# Patient Record
Sex: Female | Born: 1970 | Race: White | Hispanic: No | Marital: Married | Smoking: Current every day smoker
Health system: Southern US, Community
[De-identification: ages and names within clinical notes are randomized; demographics above are authoritative.]

## PROBLEM LIST (undated history)

## (undated) DIAGNOSIS — E785 Hyperlipidemia, unspecified: Secondary | ICD-10-CM

## (undated) DIAGNOSIS — F419 Anxiety disorder, unspecified: Secondary | ICD-10-CM

## (undated) DIAGNOSIS — D649 Anemia, unspecified: Secondary | ICD-10-CM

## (undated) DIAGNOSIS — R519 Headache, unspecified: Secondary | ICD-10-CM

## (undated) DIAGNOSIS — R112 Nausea with vomiting, unspecified: Secondary | ICD-10-CM

## (undated) DIAGNOSIS — F32A Depression, unspecified: Secondary | ICD-10-CM

## (undated) DIAGNOSIS — K219 Gastro-esophageal reflux disease without esophagitis: Secondary | ICD-10-CM

## (undated) DIAGNOSIS — F4310 Post-traumatic stress disorder, unspecified: Secondary | ICD-10-CM

## (undated) DIAGNOSIS — M1990 Unspecified osteoarthritis, unspecified site: Secondary | ICD-10-CM

## (undated) DIAGNOSIS — F319 Bipolar disorder, unspecified: Secondary | ICD-10-CM

## (undated) HISTORY — PX: OOPHORECTOMY: SHX6387

## (undated) HISTORY — PX: FEMUR LENGTHENING PROCEDURE: SHX1596

## (undated) HISTORY — PX: HERNIA REPAIR: SHX51

## (undated) HISTORY — PX: BREAST LUMPECTOMY: SHX2

## (undated) HISTORY — PX: DIAGNOSTIC LAPAROSCOPY: SUR761

---

## 1998-05-23 ENCOUNTER — Ambulatory Visit (HOSPITAL_COMMUNITY): Admission: RE | Admit: 1998-05-23 | Discharge: 1998-05-23 | Payer: Self-pay | Admitting: Gastroenterology

## 2000-01-03 ENCOUNTER — Encounter: Payer: Self-pay | Admitting: Orthopedic Surgery

## 2000-01-03 ENCOUNTER — Inpatient Hospital Stay (HOSPITAL_COMMUNITY): Admission: RE | Admit: 2000-01-03 | Discharge: 2000-01-06 | Payer: Self-pay | Admitting: Orthopedic Surgery

## 2000-03-27 ENCOUNTER — Encounter: Payer: Self-pay | Admitting: Orthopedic Surgery

## 2000-03-27 ENCOUNTER — Ambulatory Visit (HOSPITAL_COMMUNITY): Admission: RE | Admit: 2000-03-27 | Discharge: 2000-03-27 | Payer: Self-pay | Admitting: Orthopedic Surgery

## 2000-08-30 ENCOUNTER — Encounter: Payer: Self-pay | Admitting: Orthopedic Surgery

## 2000-08-30 ENCOUNTER — Observation Stay (HOSPITAL_COMMUNITY): Admission: RE | Admit: 2000-08-30 | Discharge: 2000-08-31 | Payer: Self-pay | Admitting: Orthopedic Surgery

## 2001-05-08 ENCOUNTER — Ambulatory Visit (HOSPITAL_BASED_OUTPATIENT_CLINIC_OR_DEPARTMENT_OTHER): Admission: RE | Admit: 2001-05-08 | Discharge: 2001-05-08 | Payer: Self-pay | Admitting: Orthopedic Surgery

## 2001-05-08 ENCOUNTER — Encounter: Payer: Self-pay | Admitting: Orthopedic Surgery

## 2001-07-22 ENCOUNTER — Ambulatory Visit (HOSPITAL_COMMUNITY): Admission: RE | Admit: 2001-07-22 | Discharge: 2001-07-22 | Payer: Self-pay | Admitting: Family Medicine

## 2001-07-22 ENCOUNTER — Encounter: Payer: Self-pay | Admitting: Family Medicine

## 2001-07-28 ENCOUNTER — Encounter: Payer: Self-pay | Admitting: Family Medicine

## 2001-07-28 ENCOUNTER — Ambulatory Visit (HOSPITAL_COMMUNITY): Admission: RE | Admit: 2001-07-28 | Discharge: 2001-07-28 | Payer: Self-pay | Admitting: Family Medicine

## 2001-12-22 ENCOUNTER — Observation Stay (HOSPITAL_COMMUNITY): Admission: RE | Admit: 2001-12-22 | Discharge: 2001-12-23 | Payer: Self-pay | Admitting: Obstetrics and Gynecology

## 2002-10-07 ENCOUNTER — Other Ambulatory Visit: Admission: RE | Admit: 2002-10-07 | Discharge: 2002-10-07 | Payer: Self-pay | Admitting: Obstetrics and Gynecology

## 2005-09-01 ENCOUNTER — Emergency Department (HOSPITAL_COMMUNITY): Admission: EM | Admit: 2005-09-01 | Discharge: 2005-09-01 | Payer: Self-pay | Admitting: Emergency Medicine

## 2006-02-27 ENCOUNTER — Other Ambulatory Visit: Admission: RE | Admit: 2006-02-27 | Discharge: 2006-02-27 | Payer: Self-pay | Admitting: Gynecology

## 2006-03-27 ENCOUNTER — Encounter: Admission: RE | Admit: 2006-03-27 | Discharge: 2006-03-27 | Payer: Self-pay | Admitting: Gynecology

## 2006-04-01 ENCOUNTER — Encounter: Admission: RE | Admit: 2006-04-01 | Discharge: 2006-04-01 | Payer: Self-pay | Admitting: Gynecology

## 2006-04-19 ENCOUNTER — Ambulatory Visit (HOSPITAL_COMMUNITY): Admission: RE | Admit: 2006-04-19 | Discharge: 2006-04-19 | Payer: Self-pay | Admitting: Gynecology

## 2006-05-21 ENCOUNTER — Encounter: Admission: RE | Admit: 2006-05-21 | Discharge: 2006-05-21 | Payer: Self-pay | Admitting: Gynecology

## 2006-06-03 ENCOUNTER — Encounter: Admission: RE | Admit: 2006-06-03 | Discharge: 2006-06-03 | Payer: Self-pay | Admitting: Surgery

## 2006-06-06 ENCOUNTER — Emergency Department (HOSPITAL_COMMUNITY): Admission: EM | Admit: 2006-06-06 | Discharge: 2006-06-06 | Payer: Self-pay | Admitting: *Deleted

## 2006-06-09 ENCOUNTER — Emergency Department (HOSPITAL_COMMUNITY): Admission: EM | Admit: 2006-06-09 | Discharge: 2006-06-09 | Payer: Self-pay | Admitting: Emergency Medicine

## 2006-06-20 ENCOUNTER — Ambulatory Visit (HOSPITAL_COMMUNITY): Admission: RE | Admit: 2006-06-20 | Discharge: 2006-06-20 | Payer: Self-pay | Admitting: Gynecology

## 2006-06-20 ENCOUNTER — Encounter (INDEPENDENT_AMBULATORY_CARE_PROVIDER_SITE_OTHER): Payer: Self-pay | Admitting: Specialist

## 2006-07-25 ENCOUNTER — Encounter: Admission: RE | Admit: 2006-07-25 | Discharge: 2006-07-25 | Payer: Self-pay | Admitting: Surgery

## 2006-07-25 ENCOUNTER — Ambulatory Visit (HOSPITAL_BASED_OUTPATIENT_CLINIC_OR_DEPARTMENT_OTHER): Admission: RE | Admit: 2006-07-25 | Discharge: 2006-07-25 | Payer: Self-pay | Admitting: Surgery

## 2006-07-25 ENCOUNTER — Encounter (INDEPENDENT_AMBULATORY_CARE_PROVIDER_SITE_OTHER): Payer: Self-pay | Admitting: Specialist

## 2009-03-20 ENCOUNTER — Inpatient Hospital Stay (HOSPITAL_COMMUNITY): Admission: EM | Admit: 2009-03-20 | Discharge: 2009-03-25 | Payer: Self-pay | Admitting: Emergency Medicine

## 2009-03-25 ENCOUNTER — Ambulatory Visit: Payer: Self-pay | Admitting: *Deleted

## 2009-03-25 ENCOUNTER — Inpatient Hospital Stay (HOSPITAL_COMMUNITY): Admission: AD | Admit: 2009-03-25 | Discharge: 2009-03-28 | Payer: Self-pay | Admitting: *Deleted

## 2010-07-05 IMAGING — CR DG CHEST 1V PORT
1 series · 1 of 1 positions shown · non-contrast
Comparison: None

CLINICAL DATA: Overdose

PORTABLE CHEST - 1 VIEW

[view not recorded]
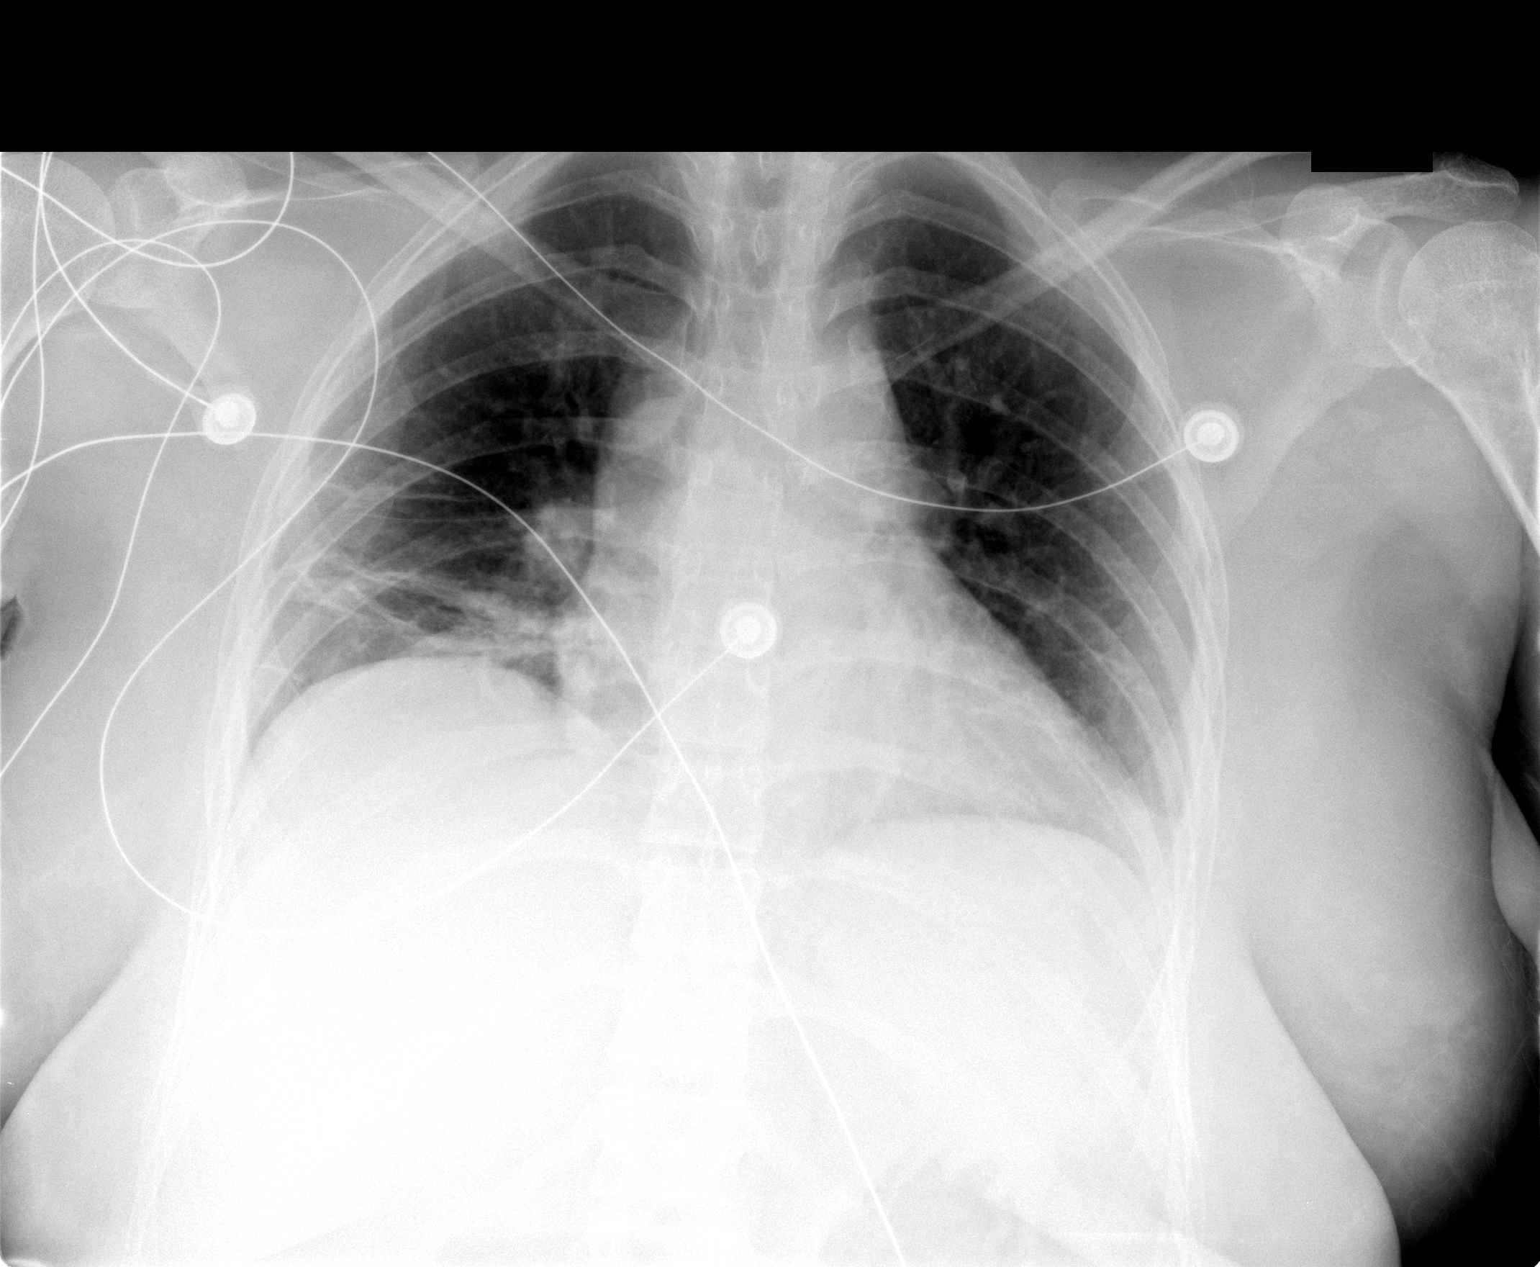

[1 of 1 positions shown; findings below may reference images not displayed]

FINDINGS: Right lower lobe subsegmental atelectasis.  Negative for
edema.  Heart and mediastinal contours are normal.
IMPRESSION: Right lower lobe subsegmental atelectasis.

## 2010-08-07 ENCOUNTER — Ambulatory Visit (HOSPITAL_COMMUNITY): Admission: RE | Admit: 2010-08-07 | Discharge: 2010-08-07 | Payer: Self-pay | Admitting: Surgery

## 2010-11-19 ENCOUNTER — Encounter: Payer: Self-pay | Admitting: Gynecology

## 2010-11-19 ENCOUNTER — Encounter: Payer: Self-pay | Admitting: Surgery

## 2011-01-11 LAB — BASIC METABOLIC PANEL
Calcium: 9.1 mg/dL (ref 8.4–10.5)
Creatinine, Ser: 0.94 mg/dL (ref 0.4–1.2)
GFR calc Af Amer: >60 mL/min (ref >60)
GFR calc non Af Amer: >60 mL/min (ref >60)
Glucose, Bld: 120 mg/dL — ABNORMAL HIGH (ref 70–99)
Potassium: 3.5 mEq/L (ref 3.5–5.1)
Sodium: 140 mEq/L (ref 135–145)

## 2011-01-11 LAB — DIFFERENTIAL
Eosinophils Absolute: 0.1 10*3/uL (ref 0.0–0.7)
Eosinophils Relative: 1 % (ref 0–5)
Lymphocytes Relative: 26 % (ref 12–46)
Lymphs Abs: 3.3 10*3/uL (ref 0.7–4.0)
Monocytes Absolute: 0.5 10*3/uL (ref 0.1–1.0)
Monocytes Relative: 4 % (ref 3–12)
Neutro Abs: 8.9 10*3/uL — ABNORMAL HIGH (ref 1.7–7.7)
Neutrophils Relative %: 69 % (ref 43–77)

## 2011-01-11 LAB — CBC
HCT: 38.8 % (ref 36.0–46.0)
Hemoglobin: 13.4 g/dL (ref 12.0–15.0)
MCV: 89.7 fL (ref 78.0–100.0)
RDW: 13.3 % (ref 11.5–15.5)
WBC: 12.9 10*3/uL — ABNORMAL HIGH (ref 4.0–10.5)

## 2011-01-11 LAB — SURGICAL PCR SCREEN: Staphylococcus aureus: POSITIVE — AB

## 2011-01-11 LAB — PREGNANCY, URINE: Preg Test, Ur: NEGATIVE

## 2011-02-06 LAB — COMPREHENSIVE METABOLIC PANEL
ALT: 12 U/L (ref 0–35)
ALT: 12 U/L (ref 0–35)
AST: 18 U/L (ref 0–37)
AST: 24 U/L (ref 0–37)
Albumin: 3.3 g/dL — ABNORMAL LOW (ref 3.5–5.2)
Albumin: 3.5 g/dL (ref 3.5–5.2)
Alkaline Phosphatase: 66 U/L (ref 39–117)
BUN: 17 mg/dL (ref 6–23)
CO2: 23 mEq/L (ref 19–32)
Calcium: 8.2 mg/dL — ABNORMAL LOW (ref 8.4–10.5)
Calcium: 8.8 mg/dL (ref 8.4–10.5)
Chloride: 105 mEq/L (ref 96–112)
Chloride: 108 mEq/L (ref 96–112)
GFR calc Af Amer: >60 mL/min (ref >60)
GFR calc non Af Amer: >60 mL/min (ref >60)
GFR calc non Af Amer: >60 mL/min (ref >60)
Potassium: 3.9 mEq/L (ref 3.5–5.1)
Total Bilirubin: 0.5 mg/dL (ref 0.3–1.2)
Total Protein: 6.3 g/dL (ref 6.0–8.3)
Total Protein: 6.5 g/dL (ref 6.0–8.3)

## 2011-02-06 LAB — RAPID URINE DRUG SCREEN, HOSP PERFORMED: Amphetamines: NOT DETECTED

## 2011-02-06 LAB — CBC
HCT: 37.7 % (ref 36.0–46.0)
HCT: 39.2 % (ref 36.0–46.0)
Hemoglobin: 13.8 g/dL (ref 12.0–15.0)
MCHC: 33.2 g/dL (ref 30.0–36.0)
MCHC: 35.3 g/dL (ref 30.0–36.0)
MCV: 89.1 fL (ref 78.0–100.0)
Platelets: 286 10*3/uL (ref 150–400)
RDW: 13.2 % (ref 11.5–15.5)
WBC: 10.1 10*3/uL (ref 4.0–10.5)
WBC: 17 10*3/uL — ABNORMAL HIGH (ref 4.0–10.5)

## 2011-02-06 LAB — APTT: aPTT: 28 seconds (ref 24–37)

## 2011-02-06 LAB — DIFFERENTIAL
Basophils Absolute: 0.1 10*3/uL (ref 0.0–0.1)
Basophils Relative: 1 % (ref 0–1)
Eosinophils Absolute: 0.6 10*3/uL (ref 0.0–0.7)
Eosinophils Relative: 6 % — ABNORMAL HIGH (ref 0–5)
Lymphs Abs: 1.2 10*3/uL (ref 0.7–4.0)
Lymphs Abs: 2.3 10*3/uL (ref 0.7–4.0)
Monocytes Relative: 3 % (ref 3–12)
Neutro Abs: 15.1 10*3/uL — ABNORMAL HIGH (ref 1.7–7.7)
Neutrophils Relative %: 66 % (ref 43–77)

## 2011-02-06 LAB — URINALYSIS, ROUTINE W REFLEX MICROSCOPIC
Ketones, ur: NEGATIVE mg/dL
Nitrite: NEGATIVE
Specific Gravity, Urine: 1.012 (ref 1.005–1.030)

## 2011-02-06 LAB — BASIC METABOLIC PANEL
BUN: 10 mg/dL (ref 6–23)
CO2: 25 mEq/L (ref 19–32)
Calcium: 8.4 mg/dL (ref 8.4–10.5)
Creatinine, Ser: 0.84 mg/dL (ref 0.4–1.2)
GFR calc Af Amer: >60 mL/min (ref >60)

## 2011-02-06 LAB — PROTIME-INR: INR: 1 (ref 0.00–1.49)

## 2011-03-13 NOTE — H&P (Signed)
NAME:  Meghan Mcpherson, Meghan Mcpherson                ACCOUNT NO.:  [id number]   MEDICAL RECORD NO.:  [id number]          PATIENT TYPE:  IPS   LOCATION:  0304                          [facility]ACILITY[facility]  [facility]   PHYSICIAN:  Vic Ripper, P.A.-C.DATE OF BIRTH:  Jun 12, 1971   DATE OF ADMISSION:  03/25/2009  DATE OF DISCHARGE:                       PSYCHIATRIC ADMISSION ASSESSMENT   This is a 40 year old separated white female.  This is a voluntary  admission to the services of Dr. Milford Cage.  Today's date is May  29th.  Apparently, she had texted her boyfriend that she had overdosed.  She took approximately 90 trazodone, 15 Flexeril, 30 Abilify, 80  Tranxene and 15-30 Luvox.  This was after they had argued.  She stated  she would not be alive tomorrow and intentionally took the overdose.  Apparently, she does this frequently, although this time she actually  did take the pills.  He did not believe her.  She was at her  grandparent's.  When they could not awaken her the next morning, they  called 9-1-1.  She states that she was being weaned off Abilify, due to  a change in insurance carriers she could no longer afford the co-pay,  and when down to 5 mg she hit bottom.   PAST PSYCHIATRIC HISTORY:  She first began therapy at age 80 when her  father died.  She was given medication for depression at age 80.  At age  6-24 she began Luvox for OCD/depression.  She states she had no meds  for approximately 10 years.  Then in April of 2009 her husband left and  she was restarted on Luvox.  She is followed by Dr. Beryle Lathe at [facility]  She is currently prescribed Luvox, Lamictal and  Abilify.   SOCIAL HISTORY:  She has an associate's degree.  She has been married  once, separated with no children.  She works the Psychologist, sport and exercise at [employer].   FAMILY HISTORY:  On the maternal side of the family there is a history  for OCD and depression.  On the paternal side depression.   ALCOHOL AND  DRUG HISTORY:  She denies.   PRIMARY CARE Vaun Hyndman:  Dr. Tenny Craw, at [facility]   PSYCHIATRIST:  Dr. Beryle Lathe, at [facility], and she does  have a therapist although she is interested in getting a new therapist,  one that is closer and more available.   MEDICAL PROBLEMS:  She is known to have GERD and endometriosis.   MEDICATIONS:  She is currently prescribed:  1. Metamucil daily.  2. Protonix 40 mg p.o. b.i.d.  3. Abilify 10 mg at hour of sleep.  4. Tranxene 15 mg p.o. b.i.d. p.r.n. anxiety.  5. Lamictal 200 mg p.o. daily.  6. Luvox 150 mg at nightly.  7. Hormone for endometriosis, it is 5 mg p.o. daily.   DRUG ALLERGIES:  No known drug allergies.   POSITIVE PHYSICAL FINDINGS:  She does have a rash under her arms and  apparently while in the hospital she was admitted on May 23rd, she was  given some kind of prednisone or something for that.  However, we will  just treat that with local hydrocortisone cream.   PHYSICAL EXAM:  Is otherwise well documented and in the chart.   MENTAL STATUS EXAM:  Today, she is alert and oriented.  She was  appropriately groomed and dressed in her own clothing.  Her mood is  depressed, her affect is congruent, her thought processes were clear,  rational and goal-oriented.  She wants to get back on the right meds.  Concentration and memory are intact.  Intelligence is at least average.  She still reports that she is at least passively suicidal, this is a  chronic situation for her.  She is not homicidal.  She is not having any  auditory or visual hallucinations.  Concentration and memory are intact,  intelligence is at least average.   DIAGNOSES:  Axis I:  Mood disorder, not otherwise specified.  Axis II:  Borderline personality disorder.  Axis III:  1.  Gastroesophageal reflux disease.  2.  Endometriosis.  Axis IV:  Problems with primary support group.  Axis V:  Global Assessment of Functioning 15.   PLAN:  Admit for  further stabilization and to provide safety.  Will work  with her regarding her discharge plan.  She is amenable to continuing  with her current psychiatrist, Dr. Beryle Lathe, but she is interested in  gaining a new therapist.  We are going to adjust her meds to:  1. Protonix 40 mg p.o. b.i.d.  2. Abilify 15 mg at hour of sleep.  3. Tranxene 15 mg p.o. b.i.d. p.r.n.  4. Lamictal 200 mg at hour of sleep.  5. Luvox 200 mg at hour of sleep.  6. Tranxene 15 mg p.o. at hour of sleep p.r.n.  7. Hydrocortisone cream 1%, apply to affected areas morning and hour      of sleep.      Vic Ripper, P.A.-C.     MD/MEDQ  D:  03/26/2009  T:  03/26/2009  Job:  [id number]
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
## 2011-03-13 NOTE — Discharge Summary (Signed)
NAME:  Meghan Mcpherson, Meghan Mcpherson NO.:  [id number]   MEDICAL RECORD NO.:  [id number]          PATIENT TYPE:  IPS   LOCATION:  0304                          FACILITY:  [facility]   PHYSICIAN:  Altha Harm, MDDATE OF BIRTH:  1971-02-07   DATE OF ADMISSION:  03/25/2009  DATE OF DISCHARGE:  03/26/2009                               DISCHARGE SUMMARY   Transfer to Behavioral Health.   DISCHARGE DIAGNOSES:  1. Failed suicide attempt.  2. Bipolar disorder.  3. Obtundation resolved.  4. Intertriginous rash.  5. Major depression.  6. Ongoing suicidal ideations.  7. History of endometriosis.  8. History of left salpingo-oophorectomy secondary to benign ovarian      cysts.  9. Weakness secondary likely to Abilify ingestion, resolved.  10.Chronic back pain.   MEDICATIONS AT DISCHARGE:  1. Protonix 40 mg p.o. b.i.d.  2. Pyridium 200 mg p.o. t.i.d.  3. Norephedrine 5 mg p.o. daily.  4. Famotidine 20 mg p.o. daily.  5. Lunesta 3 mg p.o. q.h.s. p.r.n.  6. Tranxene 15 mg p.o. b.i.d. p.r.n. anxiety.  7. Prednisone 40 mg p.o. daily x4 days.  8. Benadryl 50 mg p.o. q.h.s. p.r.n. itching.   CONSULTANTS:  Antonietta Breach, M.D.   PROCEDURE:  None.   DIAGNOSTIC STUDIES:  Portable chest x-ray done on admission shows a  right lower lobe subsegmental atelectasis.   CODE STATUS:  Full code.   ALLERGIES:  FLUTICAZONE.   CHIEF COMPLAINT:  Overdose.   HISTORY OF PRESENT ILLNESS:  Please refer to the History and Physical by  Dr. Della Goo for details of the HPI.   HOSPITAL COURSE:  1. The patient was in an obtunded state and stuporous on arrival to      the hospital after she had ingested unknown amounts of Abilify,      Effexor, Tranxene, Trazodone and Flexeril.  The patient was given      supportive care with oxygen, IV fluids, and monitored closely      during her hospitalization.  Within 48 to 72 hours, the patient      resolved her obtundation and stupor.  The patient  was seen in      consultation with Dr. Jeanie Sewer of psychiatry who deemed the      patient a candidate for inpatient rehabilitation based upon her      ongoing suicidal ideations.  2. The patient had a significant amount of weakness which I attributed      to the Flexeril, Tranxene and Ability.  Over the course of several      days, the weakness resolved and the patient at the time of my last      evaluation was ambulatory without any problems.  3. Rash.  Within the past 48 hours, the patient developed a pruritic,      erythematous rash which was initially just on the area of the right      flank.  However,  yesterday, the patient noticed that the rash was      in the area of her flank and also in both axillary areas, pruritic  in all those locations.  The rash was a confluent, macular,      erythematous, pruritic rash.  It was felt that the rash was likely      due to a contact dermatitis, likely with the deodorants that the      patient has been using in the axillary areas.  The patient did      report that she had this rash intermittently, however, it seems to      have worsened in the last 48 hours.  It was also felt that the      effects of the stress also contributed to the patient's rash.  I      doubt this is a global allergic reaction as the rash is only      occurring in very specific areas.  The patient was started on      prednisone 40 mg in addition to Benadryl for the antihistamine      effect.  I recommended that the patient have a medicine evaluation      while in Behavioral Health to continue to evaluate and make sure      that the rash is getting better and not worse.   Otherwise, the patient has remained stable.  She has essentially been  discontinued from her psychotropic medications, however, Tranxene was  restarted last night for her treatment of anxiety and insomnia.   DIETARY RESTRICTIONS:  None.   PHYSICAL RESTRICTIONS:  None.      Altha Harm, MD  Electronically Signed     MAM/MEDQ  D:  03/26/2009  T:  03/26/2009  Job:  [id number]

## 2011-03-13 NOTE — H&P (Signed)
NAME:  Meghan Mcpherson, Meghan Mcpherson                ACCOUNT NO.:  [id number]   MEDICAL RECORD NO.:  [id number]          Meghan Mcpherson TYPE:  INP   LOCATION:  1227                         FACILITY:  [facility]   PHYSICIAN:  Della Goo, M.D. DATE OF BIRTH:  [birth year]   DATE OF ADMISSION:  03/20/2009  DATE OF DISCHARGE:                              HISTORY & PHYSICAL   DATE OF ADMISSION:  Mar 20, 2009.   PRIMARY CARE PHYSICIAN:  None.   CHIEF COMPLAINT:  Overdose.   HISTORY OF PRESENT ILLNESS:  This is a 40 year old female who was  brought to the emergency department emergently at 4:00 p.m. after being  lethargic and unarousable when family found her in the afternoon after  church services.  The history has been obtained through the EMS record  and talking to a family member who is at bedside and minimal information  from the Meghan Mcpherson who is severely obtunded.  The Meghan Mcpherson's sister brought  empty pill bottles to the emergency department, and the history is as  follows per the Meghan Mcpherson's sister and aunt.  Came to the conclusion that  the Meghan Mcpherson had an argument with her boyfriend the night before and had  taken an overdose of multiple medications and went to sleep.  They state  that they found out that the Meghan Mcpherson had sent a text message before  going to sleep to her boyfriend stating that she would not be around the  next day.  The Meghan Mcpherson has a previous similar episode 1 year ago per  report of the family after becoming divorced from her husband.  Per the  Meghan Mcpherson, the Meghan Mcpherson states that she had taken 90 trazodone tablets, 15-  30 Luvox tablets, 90 Tranxene tablets, 15 Flexeril tablets and 30  Abilify tablets.   In the emergency department, the Meghan Mcpherson was evaluated and a urine drug  screen was performed, results of which returned positive for  benzodiazepines.  Salicylate level and acetaminophen level and alcohol  level were all negative.  The [facility] was also contacted  to review this  Meghan Mcpherson's case.   PAST MEDICAL HISTORY:  As mentioned above, previous suicide attempt 1  year ago.  Also history of endometriosis and history of a benign right  breast mass status post biopsy, and status post left salpingo-  oophorectomy secondary to benign left ovarian cyst.   MEDICATIONS:  As mentioned above in the HPI.  However, the Meghan Mcpherson was  not prescribed Flexeril therapy recently.   ALLERGIES:  No known drug allergies.   SOCIAL HISTORY:  The Meghan Mcpherson is a nonsmoker, nondrinker.   FAMILY HISTORY:  Positive for breast cancer in her mother.   REVIEW OF SYSTEMS:  Unable to obtain secondary to the Meghan Mcpherson's  obtundation.   PHYSICAL EXAMINATION:  This is a 40 year old obtunded, obese female in  guarded condition.  VITAL SIGNS:  Temperature 98.6, blood pressure 143/81, heart rate 95-  113, respirations 18, O2 saturation 99-100%.  HEENT:  Normocephalic, atraumatic.  There is no scleral icterus.  Pupils  are equally round and reactive to light.  Extraocular movements are  intact.  Funduscopic benign.  Nares are patent bilaterally.  Oropharynx  is clear.  NECK:  Supple, full range of motion.  No thyromegaly, adenopathy or  jugular venous distention.  CARDIOVASCULAR:  Tachycardiac rate and rhythm.  No murmurs, gallops or  rubs.  LUNGS:  Clear to auscultation bilaterally.  ABDOMEN:  Positive bowel sounds, soft, nontender, nondistended.  EXTREMITIES:  Without cyanosis, clubbing or edema.  NEUROLOGIC:  The Meghan Mcpherson is obtunded.  Her speech is mildly dysarthric.  Her cranial nerves are intact.  She is able to move all four of her  extremities.  There are no focal deficits.   LABORATORY STUDIES:  White blood cell count 17.0, hemoglobin 13.8,  hematocrit 41.6, MCV 89.5, platelets 343, neutrophils 89%, lymphocytes  7%.  Pro time 13.3, INR 1.0, PTT 28.  Sodium 139, potassium 3.9,  chloride 105, carbon dioxide 24, BUN 17, creatinine 0.86 and glucose  133.  Alcohol level less than 5.   Acetaminophen level less than 10.0.  Salicylate level less than 4.0.  Urine drug screen positive for  benzodiazepines.  Urine HCG negative.  Urinalysis negative.  EKG reveals  a mild sinus tachycardia.  There are no acute ST-segment changes.   ASSESSMENT:  A 40 year old female being admitted with:  1. Overdose.  2. Suicide attempt.  3. Sinus tachycardia.  4. Altered mental status is secondary to #1.  5. Leukocytosis.  This may be a stress leukocytosis.   PLAN:  The Meghan Mcpherson will be admitted to the step-down ICU area and placed  on suicide and safety precautions.  The Meghan Mcpherson will be n.p.o. while she  is obtunded.  IV fluids have been ordered for fluid resuscitation.  The  Meghan Mcpherson will be monitored for further cardiovascular and pulmonary  changes.  At this particular time, the Meghan Mcpherson is maintaining her  airway.  The [facility] has been contacted twice to discuss  this Meghan Mcpherson's case, and recommendations have been made to continue to  monitor the Meghan Mcpherson for cardiovascular and pulmonary compromise.  The  Meghan Mcpherson will be placed on DVT and GI prophylaxis.  Behavioral  Health/psychiatry will be consulted once the Meghan Mcpherson is oriented and  medically clear.      Della Goo, M.D.  Electronically Signed     HJ/MEDQ  D:  03/21/2009  T:  03/21/2009  Job:  [id number]

## 2011-03-13 NOTE — Consult Note (Signed)
NAME:  Meghan Mcpherson, Meghan Mcpherson                ACCOUNT NO.:  [id number]   MEDICAL RECORD NO.:  [id number]          PATIENT TYPE:  INP   LOCATION:  1516                         [facility]ACILITY:  [facility]   PHYSICIAN:  Antonietta Breach, M.D.  DATE OF BIRTH:  [birth year]   DATE OF CONSULTATION:  03/24/2009  DATE OF DISCHARGE:  03/25/2009                                 CONSULTATION   The patient can now provide a history.  She states that she reduced her  Abilify from 15 mg daily four weeks ago down to 5 mg per day due to the  cost of the medicine.  Her drop in mood correlates with this decrease in  Abilify.   She continues with a very depressed mood, anhedonia, poor energy,  difficulty concentrating and suicidal thoughts.   LABORATORY DATA:  WBC 10.1, hemoglobin 12.9, platelet count 286.   EXAMINATION:  VITALS SIGNS:  Temperature 98.1, pulse 80, respiratory  rate 20, blood pressure 98/64, oxygen saturation on room air 95%.  MENTAL STATUS EXAMINATION:  The patient is alert.  Her eye contact is  good.  Her attention span is slightly decreased.  Affect is very  restricted.  Mood is depressed.  Concentration is increased.  She is  oriented to all spheres.  Her memory is intact for immediate, recent and  remote.  Her thought process is logical and coherent, goal-directed.  No  loose associations.  Speech involves normal rate and __________without  dysarthria.  Her __________is mildly soft.  Thought content:  She has  suicidal thoughts.  Insight is partial.  Judgment is impaired.   She has much worry and has feeling on edge.   ASSESSMENT:   AXIS I:  293.00 delirium not otherwise specified, now clear.  293.83 mood disorder not otherwise specified, depressed.  293.84. anxiety disorder not otherwise specified.   RECOMMENDATIONS:  1. Would admit to an inpatient psychiatric unit for further evaluation      and treatment due to her suicidal state.  2. Regarding her acute medicine would utilize Ativan one half to  one      p.o. IM or IV t.i.d. p.r.n. anxiety.  Would be cautious about      sedation or ataxia.  3. Will defer her primary psychotropic medication at this time.      Antonietta Breach, M.D.  Electronically Signed     JW/MEDQ  D:  04/03/2009  T:  04/04/2009  Job:  [id number]

## 2011-03-13 NOTE — Consult Note (Signed)
NAME:  Meghan Mcpherson, Meghan Mcpherson NO.:  [id number]   MEDICAL RECORD NO.:  [id number]          PATIENT TYPE:  INP   LOCATION:  1516                         FACILITY:  [facility]   PHYSICIAN:  Antonietta Breach, M.D.  DATE OF BIRTH:  26-May-1971   DATE OF CONSULTATION:  03/22/2009  DATE OF DISCHARGE:                                 CONSULTATION   REQUESTING PHYSICIAN:  [facility].   REASON FOR CONSULTATION:  Overdose.   HISTORY OF PRESENT ILLNESS:  Ms. Meghan Mcpherson is a 40 year old female admitted  to the [facility] on Mar 20, 2009, due to acute mental status  changes after an intentional drug overdose.   Ms. Meghan Mcpherson has been experiencing depressed mood for an unknown time.  She  sent a text message to her boyfriend on the night of the overdose  stating that she would not be around the next day.   She took an overdose on the day prior to admission of approximately 90  trazodone tablets 15 Flexeril tablets, 30 Abilify tablets, 90 Tranxene  tablets and 15 to 30 Luvox tablets.   It is known that she had an argument with her boyfriend prior to the  overdose.   Currently Ms. Meghan Mcpherson is still experiencing stupor.  She does have  depressed mood.  She does agree that she deliberately took the overdose.  She does agree that she needs to be in a psychiatric hospital.   However, she does have difficulty with orientation and states that the  year is 2003.  She easily nods off at this point.  Her thought process  is coherent; however, she is not alert enough to perform other formal  mental status examination.  She is not agitated or combative.  She is  cooperative with care.   PAST PSYCHIATRIC HISTORY:  Ms. Meghan Mcpherson does have a history of another  suicide attempt approximately 1 year ago after the divorce from her  husband.   In review of the past medical record, it is noted that in August 2007  she was using social alcohol but no other substance use was mentioned,  and there  was no mention of any complications from substance use.   At this time Ms. Meghan Mcpherson is not an adequate historian.   FAMILY PSYCHIATRIC HISTORY:  None known.   SOCIAL HISTORY:  Please see the above.  She currently has had an  argument with her boyfriend.  She has a sister who brought her empty  pill bottles to the emergency department.  She has other family that  lives in the region.   No known illegal drugs.   PAST MEDICAL HISTORY:  History of laparoscopic salpingo-oophorectomy for  a left ovarian mass.  She also has undergone an MRI-guided biopsy of a  right breast mass in the past.  She is now status post overdose.  She  has a history of endometriosis.  The biopsy of the breast was benign.   MEDICATIONS:  The MAR is reviewed.  She is not on any current  psychotropic medication.   She has no known drug allergies.  LABORATORY DATA:  Sodium 136, BUN 10, creatinine 0.7, glucose 113, SGOT  18, SGPT 12.   WBC 16.5, hemoglobin 13.8, platelet count 297.  Urine drug screen was  positive for benzodiazepines.  Aspirin negative.  Alcohol negative.  Tylenol negative.  Urine HCG negative.  INR normal.   REVIEW OF SYSTEMS:  Ms. Meghan Mcpherson is not able to provide this.  It is  gleaned from the staff, past medical record and electronic medical  record.   Constitutional, head, eyes, ears, nose, mouth, neurologic, psychiatric,  cardiovascular, respiratory, gastrointestinal, genitourinary, skin,  musculoskeletal, hematologic/lymphatic, endocrine/metabolic all  unremarkable.   EXAMINATION:  VITAL SIGNS:  Temperature 98.9, pulse 97, respiratory rate  24, blood pressure 134/85, O2 saturation on room air 93%.  GENERAL APPEARANCE:  Ms. Meghan Mcpherson is a middle-aged female sitting up in her  hospital bed with no abnormal involuntary movements.  MENTAL STATUS EXAM:  Ms. Meghan Mcpherson has stupor.  She nods out easily during  the conversation and reclines in her bed, which is in a partial supine  position.  Her  concentration is decreased.  Her affect is constricted.  Mood is depressed.  On orientation questioning, she states that the year  Is 2003.  She is oriented to person.  Her memory is grossly impaired.  She cannot engage in formal mental status questions regarding memory,  abstraction, Fund of knowledge or intelligence.  Speech has a slightly  flat prosody.  Her speech is impoverished.  There is no dysarthria.  Thought process:  She gives very short, coherent phrases and then nods  back off.  Thought content:  Unclear at this point.  Insight is poor.  Judgment is impaired.   ASSESSMENT:  Axis I:  293.00, Delirium, not otherwise specified.  This  does appear to be secondary to her residual intoxication from her  overdose.  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  Rule out 293.84, anxiety disorder, not otherwise specified.  This  category is mentioned since she was taking Tranxene.  Axis II:  Deferred.  Axis III:  See past medical history.  Axis IV:  Primary support group.  Axis V:  15.   Given the history of suicide attempt in the past and her current acute  history, Ms. Meghan Mcpherson is assessed to be at risk for suicide even though her  ability to discuss definitive symptoms is impaired.   Also, she has attempted another suicide while undergoing outpatient  treatment.   Therefore, the undersigned does recommend that she be admitted to an  inpatient psychiatric unit for further evaluation and treatment once  cleared from a general medical perspective.   Would continue to provide low-stimulation ego support.   If she does manifest any hallucinations or delusions and her QTc is less  than 415 milliseconds, would utilize Haldol 0.5 mg b.i.d. for anti-  delirium, monitoring for stiffness or other extrapyramidal side effects.   However, at this point she will likely clear her delirium symptoms with  conservative  support.      Antonietta Breach, M.D.  Electronically Signed     JW/MEDQ  D:  03/22/2009  T:  03/22/2009  Job:  [id number]

## 2011-03-16 NOTE — H&P (Signed)
NAME:  Meghan Mcpherson, Meghan Mcpherson                ACCOUNT NO.:  [id number]   MEDICAL RECORD NO.:  [id number]          PATIENT TYPE:  AMB   LOCATION:                                FACILITY:  [facility]   PHYSICIAN:  Ivor Costa. Farrel Gobble, M.D. DATE OF BIRTH:  1970/12/10   DATE OF ADMISSION:  06/20/2006  DATE OF DISCHARGE:                                HISTORY & PHYSICAL   CHIEF COMPLAINT:  Complex endometrial mass suspect endometrioma.   HISTORY OF PRESENT ILLNESS:  The patient is a 40 year old G-0 with history  of primary infertility.  The patient had, what turned out to be an  exploratory laparotomy with an ovarian cystectomy on the left hand side done  in 2003 for what turned out to be an endometrioma.  As part of the  evaluation the patient had an ultrasound in our office which shows what  appears to be an ovarian endometrioma on the left hand side.  The patient's  exam was also significant for some nodularity over the uterosacral ligaments  as well as some uterine tenderness.  The patient had an HSG performed which  showed both tubes were patent, and a normal smooth cavity.  The patient  denies dysmenorrhea or dyspareunia.  She has no other personal GYN  complaints.  She presents now for a laparoscopic, per her request, LSO with  fulguration and excision of endometriosis and possible exploratory  laparotomy.   PAST OB/GYN HISTORY:  As mentioned above.   FAMILY HISTORY:  Significant for breast cancer diagnosed in her mother at 65  at advanced stage.  Similarly there were breast cancer diagnosed in her  sister in her 26s and in a maternal aunt who was diagnosed with ovarian  cancer and is status post chemo; and is about 1-year out at this point.  Because of that, we stressed the importance that the patient have a breast  MRI as well as an ultrasound.  There is a lesion seen on the MRI, however,  needle guided biopsy was unsuccessful; and at the time of our surgery the  patient still has not had an  adequate sampling of this area.   PAST MEDICAL HISTORY:  Significant for regurgitation.   SURGICAL HISTORY:  She had some orthopedic surgery on her right leg  secondary to a broken femur.  She also had an exploratory laparotomy with  removal of a left ovarian cyst in 2003.   MEDICATIONS:  1. Protonix 40 mg.  2. Astelin.  3. Nasonex.  4. Vitamins.   ALLERGIES:  The patient has no known drug allergies.   SOCIAL HISTORY:  She is married.  Social alcohol, no tobacco.  She does yoga  and walking on a regular basis.   FAMILY HISTORY:  As mentioned above.   PHYSICAL EXAMINATION:  GENERAL:  She is a well-appearing female in no acute  distress.  HEART:  Regular rate.  LUNGS:  Clear to auscultation.  ABDOMEN:  Soft and nontender.  GYN EXAM:  She has normal external female genitalia.  The BUS was negative.  The vagina was pink and moist with  a slight amount of menstruum.  No  cervical motion tenderness.  The uterus is mobile and nontender.  RECTOVAGINAL EXAM:  Deferred.   ASSESSMENT:  History of endometrioma with recurrence on the left-hand side.  The patient was originally scheduled for a laparoscopic cystectomy, possible  oophorectomy; however, at our preop consultation the patient prefers to have  the tube and ovary removed on that side because this is her second  recurrence in 4 years. We will also treat any other endometriosis that we  see in the pelvis at that time.  We are trying to coordinate with Dr. Jamey Ripa  to have the MRI guided biopsy done at the same time as the patient will  already be in the operating room and he is trying to coordinate that from  his side.  The patient was given a prescription for Percocet 5 to take 1-2  q.4 h. p.r.n. pain #20.  We also had a long discussion with her with regard  to bowel prepping prior to surgery and we gave her some Ambien 12.5 because  of a longstanding history of insomnia.  We will plan on seeing her on the  23rd.      Ivor Costa. Farrel Gobble, M.D.  Electronically Signed     THL/MEDQ  D:  06/18/2006  T:  06/18/2006  Job:  [id number]

## 2011-03-16 NOTE — Op Note (Signed)
NAME:  Meghan Mcpherson, Meghan Mcpherson                ACCOUNT NO.:  [id number]   MEDICAL RECORD NO.:  [id number]          PATIENT TYPE:  AMB   LOCATION:  SDC                           FACILITY:  [facility]   PHYSICIAN:  Ivor Costa. Farrel Gobble, M.D. DATE OF BIRTH:  [birth year]   DATE OF PROCEDURE:  06/20/2006  DATE OF DISCHARGE:  06/20/2006                                 OPERATIVE REPORT   PREOPERATIVE DIAGNOSES:  1. Leftovarian mass.  2. History of endometriosis.   POSTOPERATIVE DIAGNOSES:  1. Leftovarian mass.  2. History of endometriosis.   OPERATION PERFORMED:  Laparoscopic salpingo-oophorectomy.   SURGEON:  Ivor Costa. Farrel Gobble, M.D.   ADDITIONAL PROCEDURE:  Fulguration and excision of endometriosis.   ASSISTANT:  Edyth Gunnels, M.D.   ANESTHESIA:  General.   ESTIMATED BLOOD LOSS:  Minimal.   FINDINGS:  There were clear vascular and red blebs on the serosal surface of  the uterus.  Posteriorly there were some filmy adhesions as well as some  blue-black and clear adhesions of the cul-de-sac.  There was black adhesion  on the right tube.  The right ovary was adherent to the side wall.  The left  ovary was grossly enlarged adherent to the side wall as well as some minimal  adhesions to the bowel of the sigmoid.   DESCRIPTION OF PROCEDURE:  The patient was taken to the operating room,  general anesthesia was induced, placed in the dorsal lithotomy position,  prepped and draped in the usual sterile fashion.  A bivalve speculum was  placed in the vagina.  The cervix was visualized and stabilized using a  single toothed tenaculum.  The uterus needed to be sounded for orientation  prior to uterine manipulator being able to be placed after which gloves were  changed and infraumbilical incision was made with a scalpel.  The abdomen  was entered with OptiVu with low flow CO2 after which high flow was  obtained.  The pelvis was inspected.  The uterus was noted to be markedly  immobile and there were some  adhesions to the left sidewall that obscured  our visualization of the left adnexa.  Two 5 mm ports were then made under  direct visualization and the patient was placed in Trendelenburg position.  The bowel were able to be swept away.  The patient had requested prior to  surgery a left salpingo-oophorectomy instead of cystectomy and therefore we  began to gently lyse the adhesions with the EndoShears.  This was done  initially on the medial aspect.  The ovary was then able be deviated  medially and the infundibulopelvic ligament was able to be identified.  The  peritoneum was noted to be thickened, hugging the ovary.  The IP was  cauterized and then transected.  We gently began to take down the posterior  leaf of the broad ligament staying very superficial and hugging the ovary.  The ovary again was rotated more medially and we began to work on the medial  aspect as well.  During this time, a small cystotomy occurred and brown  fluid consistent with an endometrioma began  to ooze from the rent.  Once the  ovary deflated, we were able to get better traction.  The medial aspect of  the round ligament and tubo-ovarian ligament were cauterized and transected.  The dissection carried through carefully with attention to the underlying  bowel as well as the ureter and finally the specimen was able to be bluntly  and sharply removed from the sidewall.  The #5 scope was then placed in the  right lower port.  The EndoPouch was placed infraumbilically and the  specimen was placed in and removed.  The operative site was irrigated with  copious amounts of saline and small areas of bleeding were treated where  appropriate with cautery although it was basically hemostatic.  The uterus  was then elevated.  The right tubo-ovarian ligament was identified.  The  ovary was noted to be markedly adherent to the side wall but was able to be  bluntly and sharply dissected off.  Of note during the dissection on  the  left hand side, hydrodissection was also used.  There were some bleb-like  adhesions posteriorly that were grasped and removed sharply.  The YAG laser  with the 4 mm round tip was then introduced through the operative port.  The  areas of endometriosis were then cauterized on the uterus, tube and  posterior cul-de-sac.  The ureter was easily visualized on the right.  It  was somewhat obscured during the tract on the left secondary to the  thickened peritoneum.  Therefore indigo carmine was injected into the pelvis  and after adequate flow was seen, there was nothing seen in the operative  field.  The pelvis again was irrigated.  Interceed was then placed over the  left side wall operative site although it was hemostatic to minimize any re-  adherence of adhesions.  The instruments were then removed under direct  visualization.  The fascial port was closed with 0 Vicryl.  The in and out  port was closed with 3-0 plains, the lower ports were closed with Steri-  Strips and tincture of benzoin.  All three ports were injected with a total  of 15 mL of 0.25% Marcaine.  The patient tolerated the procedure well.  Sponge, lap, needle counts all correct times two.  She was then taken to  PACU in stable condition.      Ivor Costa. Farrel Gobble, M.D.  Electronically Signed     THL/MEDQ  D:  06/20/2006  T:  06/20/2006  Job:  [id number]

## 2011-03-16 NOTE — Op Note (Signed)
NAME:  Meghan Mcpherson, Meghan Mcpherson                ACCOUNT NO.:  [id number]   MEDICAL RECORD NO.:  [id number]          PATIENT TYPE:  AMB   LOCATION:  DSC                          FACILITY:  [facility]   PHYSICIAN:  Currie Paris, M.D.DATE OF BIRTH:  [birth year]   DATE OF PROCEDURE:  07/25/2006  DATE OF DISCHARGE:  07/25/2006                                 OPERATIVE REPORT   OFFICE MEDICAL RECORD NUMBER:  [id number].   PREOPERATIVE DIAGNOSIS:  Right breast density, uncertain etiology.   POSTOPERATIVE DIAGNOSIS:  Right breast density, uncertain etiology.   OPERATION:  MRI-guided biopsy of right breast mass.   SURGEON:  Currie Paris, M.D.   ANESTHESIA:  MAC.   CLINICAL HISTORY:  Ms. Meghan Mcpherson is a 35-hour lady who has a strong family  history of breast cancer and has an abnormality on her MRI very medially and  inferiorly located that was unable to be adequately assessed either with  ultrasound or mammogram.  An attempt at a biopsy was done, but it was too  close to the chest wall, a clip was left behind in the vicinity, but then  the clip was unable to be localized with an ultrasound or mammogram.  Therefore, MRI localization was to be carried out to biopsy this somewhat  suspicious area.   DESCRIPTION OF PROCEDURE:  The patient was seen in the holding area, and she  had no further questions.  Her MRI today had been done, and the area in  question had been marked with ink and a vitamin E capsule taped directly  over it.  The films were reviewed.  The abnormality appeared close to the  chest wall directly underneath where the vertical and horizontal axis of the  mark met, and at that point we located the vitamin E capsule.  The patient  had no further questions.   The patient was taken to the operating room. After satisfactory IV sedation,  the breast was prepped and draped, and the time-out occurred.  I infiltrated  a combination of 1% Xylocaine plain and 0.25% Marcaine plain and used  for  local, infiltrating first transversely directly over the mark on the skin.  This was almost in the inframammary fold.  I then infiltrated the skin for a  couple of centimeters superiorly and inferiorly to this, and then deep into  the subcutaneous tissues and breast tissue.  Once adequate local anesthesia  had been obtained, I made a transverse incision centered on the crossing  points of the vertical horizontal marks.  I divided a little of the  subcutaneous tissue.  I then raised a bit of a flap going superiorly at  least 2 cm above the skin line so that I was well above the area in  question.  I did see some dense fibrotic-looking breast tissue.  I went down  to the chest wall in this direction.  I then did a similar flap inferiorly.  Since I thought medially I was well beyond the area in question, I just  divided the breast tissue down to the chest wall, and then took this  full-  thickness layer of subcutaneous  tissue and breast tissue out using cautery,  taking it directly off of the muscle fascia.  When I got to the lateral  edge, I disconnected the tissue.  By palpation there was a fibrotic nodular  density within it.   I infiltrated additional local as we worked, and then more at the end to be  sure the patient had good pain control after surgery.  I carefully palpated  the superior and inferior margins of the tissue removed to make sure there  was not any palpable abnormality left behind, and everything appeared to be  normal.   I then closed incision was some 3-0 Vicryl, tacking the subcutaneous down to  the fascia so that we had less of a dead space, and then closed the skin  with some 4-0 Monocryl subcuticular plus Dermabond.   The patient tolerated the procedure well.  There were no operative  complications.  All counts were correct.   Mammography of the specimen revealed that the previously placed marking clip  was contained within the specimen.      Currie Paris, M.D.  Electronically Signed     CJS/MEDQ  D:  07/25/2006  T:  07/27/2006  Job:  [id number]   cc:   Ivor Costa. Farrel Gobble, M.D.

## 2011-05-14 ENCOUNTER — Ambulatory Visit: Payer: BC Managed Care – PPO | Attending: Family Medicine | Admitting: Physical Therapy

## 2011-05-14 DIAGNOSIS — M2560 Stiffness of unspecified joint, not elsewhere classified: Secondary | ICD-10-CM | POA: Insufficient documentation

## 2011-05-14 DIAGNOSIS — IMO0001 Reserved for inherently not codable concepts without codable children: Secondary | ICD-10-CM | POA: Insufficient documentation

## 2011-05-14 DIAGNOSIS — M546 Pain in thoracic spine: Secondary | ICD-10-CM | POA: Insufficient documentation

## 2011-05-18 ENCOUNTER — Encounter: Payer: Self-pay | Admitting: Physical Therapy

## 2011-05-24 ENCOUNTER — Ambulatory Visit: Payer: BC Managed Care – PPO | Admitting: Physical Therapy

## 2014-06-03 ENCOUNTER — Ambulatory Visit: Payer: BC Managed Care – PPO

## 2014-07-13 ENCOUNTER — Ambulatory Visit: Payer: BC Managed Care – PPO

## 2014-07-22 ENCOUNTER — Ambulatory Visit: Payer: 59 | Attending: Orthopedic Surgery

## 2014-07-22 DIAGNOSIS — M25579 Pain in unspecified ankle and joints of unspecified foot: Secondary | ICD-10-CM | POA: Insufficient documentation

## 2014-07-22 DIAGNOSIS — IMO0001 Reserved for inherently not codable concepts without codable children: Secondary | ICD-10-CM | POA: Diagnosis present

## 2014-07-22 DIAGNOSIS — R269 Unspecified abnormalities of gait and mobility: Secondary | ICD-10-CM | POA: Diagnosis not present

## 2014-08-03 ENCOUNTER — Ambulatory Visit: Payer: 59 | Attending: Orthopedic Surgery | Admitting: Rehabilitation

## 2014-08-03 DIAGNOSIS — R269 Unspecified abnormalities of gait and mobility: Secondary | ICD-10-CM | POA: Insufficient documentation

## 2014-08-03 DIAGNOSIS — M25572 Pain in left ankle and joints of left foot: Secondary | ICD-10-CM | POA: Insufficient documentation

## 2014-08-03 DIAGNOSIS — Z5189 Encounter for other specified aftercare: Secondary | ICD-10-CM | POA: Diagnosis present

## 2014-08-10 ENCOUNTER — Ambulatory Visit: Payer: 59 | Admitting: Rehabilitation

## 2014-08-10 DIAGNOSIS — Z5189 Encounter for other specified aftercare: Secondary | ICD-10-CM | POA: Diagnosis not present

## 2014-08-17 ENCOUNTER — Ambulatory Visit: Payer: 59

## 2014-08-17 DIAGNOSIS — Z5189 Encounter for other specified aftercare: Secondary | ICD-10-CM | POA: Diagnosis not present

## 2014-09-01 ENCOUNTER — Encounter: Payer: Self-pay | Admitting: Physical Therapy

## 2014-09-01 ENCOUNTER — Ambulatory Visit: Payer: 59 | Attending: Orthopedic Surgery | Admitting: Physical Therapy

## 2014-09-01 ENCOUNTER — Telehealth: Payer: Self-pay | Admitting: *Deleted

## 2014-09-01 DIAGNOSIS — R269 Unspecified abnormalities of gait and mobility: Secondary | ICD-10-CM | POA: Diagnosis not present

## 2014-09-01 DIAGNOSIS — M25572 Pain in left ankle and joints of left foot: Secondary | ICD-10-CM | POA: Diagnosis not present

## 2014-09-01 DIAGNOSIS — Z5189 Encounter for other specified aftercare: Secondary | ICD-10-CM | POA: Insufficient documentation

## 2014-09-01 DIAGNOSIS — M722 Plantar fascial fibromatosis: Secondary | ICD-10-CM

## 2014-09-01 NOTE — Therapy (Addendum)
Physical Therapy Treatment  Patient Details  Name: Meghan RazorYvonne N Grills MRN: [id number] Date of Birth: 01/04/1971  Encounter Date: 09/01/2014      PT End of Session - 09/01/14 1508    Visit Number 5   Number of Visits 12   Date for PT Re-Evaluation 09/02/14   PT Start Time 1416   PT Stop Time 1505   PT Time Calculation (min) 49 min   Activity Tolerance Patient tolerated treatment well      History reviewed. No pertinent past medical history.  History reviewed. No pertinent past surgical history.  There were no vitals taken for this visit.  Visit Diagnosis:  Plantar fasciitis, left      Subjective Assessment - 09/01/14 1418    Currently in Pain? Yes   Pain Score 4    Pain Location Foot   Pain Orientation Other (Comment)  ball of foot and towards arch   Pain Descriptors / Indicators Aching   Pain Type Chronic pain   Aggravating Factors  walking, first thing in the AM   Pain Relieving Factors stretching   Multiple Pain Sites No            Adult PT Treatment/Exercise - 09/01/14 0700    Iontophoresis   Type of Iontophoresis Dexamethasone   Location --  Lt. heel   Dose --  2.5 MA   Time --  15 min   Manual Therapy   Manual Therapy Massage   Manual Therapy   Massage --  Soft tissue to gastroc/soleus and plantar fascia, deep   Ankle Exercises: Stretches   Plantar Fascia Stretch 30 seconds;3 reps  off step   Soleus Stretch 30 seconds;3 reps  on wall   Gastroc Stretch 30 seconds;3 reps  on wall   Ankle Exercises: Standing   Heel Raises 20 reps  parallel 1 set, V 1 set          Education - 09/01/14 1458    Education provided Yes   Education Details HEP, footwear   Education Details Patient   Methods Explanation;Demonstration   Comprehension Verbalized understanding;Returned demonstration          PT Short Term Goals - 09/01/14 1521    PT SHORT TERM GOAL #1   Title report pain decrease with activity   Time 3   Period Weeks   Status On-going          PT Long Term Goals - 09/01/14 1522    PT LONG TERM GOAL #1   Title Pt will be I with advanced HEP   Time 6   Period Weeks   Status On-going   PT LONG TERM GOAL #2   Title Pt. will report pain decrease to <1/10 with walking.   Time 6   Period Weeks   Status On-going   PT LONG TERM GOAL #3   Title Pt. will report ability to walk at least 1 mile with pain <2/10   Time 6   Period Weeks   Status On-going          Plan - 09/01/14 1510    Clinical Impression Statement Patient will benefit from further treatment for iontophoresis.  She reports increased pain lately but also not being consistent with exercises.    Pt will benefit from skilled therapeutic intervention in order to improve on the following deficits Difficulty walking;Decreased range of motion;Pain   Rehab Potential Excellent   PT Frequency Min 2X/week   PT Duration 6 weeks   PT  Treatment/Interventions Modalities;Manual techniques;Therapeutic exercise;Patient/family education   PT Plan Finish ionto and repeat manual/soft tissue work        Problem List There are no active problems to display for this patient.                                           PAA,JENNIFER 09/01/2014, 3:24 PM

## 2014-09-01 NOTE — Patient Instructions (Signed)
Instructed patient to stop doing "cupping" exercise to increase arch as she already has high arches.  She was also told to regularly stretch and possibly get foot analyzed for proper footwear (vs Flip flops)

## 2014-09-01 NOTE — Telephone Encounter (Signed)
appt made w/ Gayla DossJenn Paa the week of Nov. 16 due to Judeth CornfieldStephanie will be off that week...Marland Kitchen.Marland Kitchen.td

## 2014-09-07 ENCOUNTER — Ambulatory Visit: Payer: 59 | Admitting: Physical Therapy

## 2014-09-07 ENCOUNTER — Telehealth: Payer: Self-pay | Admitting: *Deleted

## 2014-09-07 DIAGNOSIS — Z5189 Encounter for other specified aftercare: Secondary | ICD-10-CM | POA: Diagnosis not present

## 2014-09-07 DIAGNOSIS — M722 Plantar fascial fibromatosis: Secondary | ICD-10-CM

## 2014-09-07 NOTE — Therapy (Signed)
Physical Therapy Treatment  Patient Details  Name: Meghan Mcpherson MRN: [id number] Date of Birth: 03/22/1971  Encounter Date: 09/07/2014      PT End of Session - 09/07/14 1542    Visit Number 6   Number of Visits 10   Date for PT Re-Evaluation 09/28/14   PT Start Time 1457   PT Stop Time 1550   PT Time Calculation (min) 53 min      No past medical history on file.  No past surgical history on file.  There were no vitals taken for this visit.  Visit Diagnosis:  Plantar fasciitis, left - Plan: PT plan of care cert/re-cert      Subjective Assessment - 09/07/14 1537    Symptoms No pain since last treatment, except for that initial step  Would like more visits to finish Ionto   Limitations Walking   Currently in Pain? No/denies          [facility] - 09/07/14 0001    AROM   Left Ankle Dorsiflexion 11   Left Ankle Plantar Flexion 43   Left Ankle Inversion 48   Left Ankle Eversion 40   Strength   Left Ankle Dorsiflexion 5/5   Left Ankle Plantar Flexion 4/5  pain with single leg heel raise   Left Ankle Inversion 5/5   Left Ankle Eversion 5/5          [facility] Adult PT Treatment/Exercise - 09/07/14 0001    Iontophoresis   Type of Iontophoresis Dexamethasone   Location --  LT. plantar fascia   Dose 2.0 MA   Time 15 min   Manual Therapy   Manual Therapy Massage  Deep pressure to gastroc/soleus and plantar fascia            PT Short Term Goals - 09/07/14 1511    PT SHORT TERM GOAL #1   Title report pain decrease with activity   Status Achieved          PT Long Term Goals - 09/07/14 1531    PT LONG TERM GOAL #1   Title Pt will be I with advanced HEP   Baseline needs cues   Time 4   Period Weeks   Status On-going   PT LONG TERM GOAL #2   Title Pt. will report pain decrease to <5/10 first step in the AM   Baseline can be 7/10 but only for initial step   Time 4   Period Weeks   Status Revised   PT LONG TERM GOAL #3   Title Pt. will  report ability to walk at least 1 mile with pain <2/10   Baseline has not done   Time 4   Period Weeks   Status On-going          Plan - 09/07/14 1543    Clinical Impression Statement This patient is responding well to Iontophoresis and manual for Lt. plantar fascitiis.     Pt will benefit from skilled therapeutic intervention in order to improve on the following deficits Pain;Decreased strength;Increased fascial restricitons   Rehab Potential Excellent   PT Frequency 1x / week   PT Duration 4 weeks   PT Treatment/Interventions Ultrasound;Therapeutic exercise;Passive range of motion;Moist Heat;Cryotherapy;Patient/family education;Manual techniques   PT Next Visit Plan Repeat ionto and any modalities, ther exercise   Consulted and Agree with Plan of Care Patient   PT Plan See above        Problem List There are no active problems to  display for this patient.                                             PAA,JENNIFER 09/07/2014, 3:56 PM

## 2014-09-07 NOTE — Telephone Encounter (Signed)
appts made and printed...td 

## 2014-09-15 ENCOUNTER — Encounter: Payer: Self-pay | Admitting: Physical Therapy

## 2014-09-15 ENCOUNTER — Ambulatory Visit: Payer: 59 | Admitting: Physical Therapy

## 2014-09-15 DIAGNOSIS — Z5189 Encounter for other specified aftercare: Secondary | ICD-10-CM | POA: Diagnosis not present

## 2014-09-15 DIAGNOSIS — M722 Plantar fascial fibromatosis: Secondary | ICD-10-CM

## 2014-09-15 NOTE — Therapy (Signed)
Physical Therapy Treatment  Patient Details  Name: Meghan Mcpherson MRN: [id number] Date of Birth: 08/02/1971  Encounter Date: 09/15/2014      PT End of Session - 09/15/14 1007    Visit Number 7   Number of Visits 10   PT Start Time 0935   PT Stop Time 1020   PT Time Calculation (min) 45 min   Activity Tolerance Patient tolerated treatment well      History reviewed. No pertinent past medical history.  History reviewed. No pertinent past surgical history.  There were no vitals taken for this visit.  Visit Diagnosis:  Plantar fasciitis, left      Subjective Assessment - 09/15/14 0936    Symptoms No pain today, only has pain (mod ) for the 1st few steps.  Still bothers me when I walk.    Pertinent History complains of shin splints with walking.  Feels more conscious of using LEs equally when walking.  Trying not to overuse Rt. LE to compensate.    Limitations Walking   How long can you walk comfortably? can walk 1 mile but pain min    Patient Stated Goals To walk a mile pain free.    Currently in Pain? No/denies            [facility] Adult PT Treatment/Exercise - 09/15/14 0944    Iontophoresis   Type of Iontophoresis Dexamethasone   Location --  Lt. heel and plantar fascia   Dose --  3.926mA   Time 11   Manual Therapy   Manual Therapy Massage   Massage mod pressure to platar fascai and gastroc/soleus with stretching   Ankle Exercises: Aerobic   Stationary Bike 5 min level 2   Ankle Exercises: Standing   Rocker Board --  stretching to calves, followed by balance work, min dyn   Heel Raises 20 reps   Heel Raises Limitations decreased height on L LE              PT Long Term Goals - 09/15/14 1009    PT LONG TERM GOAL #1   Title Pt will be I with advanced HEP   Status Achieved   PT LONG TERM GOAL #2   Title Pt. will report pain decrease to <5/10 first step in the AM   Baseline 5/10 first step   Status On-going   PT LONG TERM GOAL #3   Title Pt. will  report ability to walk at least 1 mile with pain <2/10   Status Achieved          Plan - 09/15/14 1007    Clinical Impression Statement Meghan Mcpherson continues to improve.  She will benefit from continued ionto and manual to reduce pain to min or less.     Pt will benefit from skilled therapeutic intervention in order to improve on the following deficits Pain;Decreased strength;Increased fascial restricitons   Rehab Potential Excellent   PT Treatment/Interventions Ultrasound;Therapeutic exercise;Passive range of motion;Moist Heat;Cryotherapy;Patient/family education;Manual techniques   PT Next Visit Plan Repeat ionto and any modalities, ther exercise   Consulted and Agree with Plan of Care Patient   PT Plan She may decide to cancel last 2 visits, depending on how she feels this weekend.         Problem List There are no active problems to display for this patient.  Karie MainlandJennifer Sedric Guia, MPT 09/15/2014, 10:11 AM

## 2014-09-21 ENCOUNTER — Encounter: Payer: BC Managed Care – PPO | Admitting: Physical Therapy

## 2014-09-28 ENCOUNTER — Telehealth: Payer: Self-pay | Admitting: *Deleted

## 2014-09-28 ENCOUNTER — Ambulatory Visit: Payer: 59 | Attending: Orthopedic Surgery | Admitting: Rehabilitation

## 2014-09-28 DIAGNOSIS — M25572 Pain in left ankle and joints of left foot: Secondary | ICD-10-CM | POA: Insufficient documentation

## 2014-09-28 DIAGNOSIS — Z5189 Encounter for other specified aftercare: Secondary | ICD-10-CM | POA: Diagnosis present

## 2014-09-28 DIAGNOSIS — R269 Unspecified abnormalities of gait and mobility: Secondary | ICD-10-CM | POA: Insufficient documentation

## 2014-09-28 DIAGNOSIS — M722 Plantar fascial fibromatosis: Secondary | ICD-10-CM

## 2014-09-28 NOTE — Telephone Encounter (Signed)
per Gayla DossJenn Paa to d/c the pt...td

## 2014-09-28 NOTE — Therapy (Signed)
Physical Therapy Treatment  Patient Details  Name: Meghan Mcpherson MRN: [id number] Date of Birth: 1971/07/28  Encounter Date: 09/28/2014      PT End of Session - 09/28/14 1336    Visit Number 8   Number of Visits 10   Date for PT Re-Evaluation --   PT Start Time 0130   PT Stop Time 0155   PT Time Calculation (min) 25 min      No past medical history on file.  No past surgical history on file.  There were no vitals taken for this visit.  Visit Diagnosis:  Plantar fasciitis, left      Subjective Assessment - 09/28/14 1333    Symptoms no pain today, reports no pain in the morning anymore, 1/10 after donning tennis shoes that resolves after 5 minutes, no increased pain with walking 1 mile.    Currently in Pain? No/denies          [facility] PT Assessment - 09/28/14 1340    Strength   Right Ankle Plantar Flexion 4/5  no pain   Right Ankle Inversion 5/5   Left Ankle Dorsiflexion 5/5   Left Ankle Plantar Flexion 4/5  no pain   Left Ankle Inversion 5/5   Left Ankle Eversion 5/5   Balance   Balance Assessed No          [facility] Adult PT Treatment/Exercise - 09/28/14 1345    Ankle Exercises: Standing   SLS 10  bilateral best   Heel Raises 10 reps  edge of step   Ankle Exercises: Aerobic   Stationary Bike 5 min level 2   Ankle Exercises: Stretches   Soleus Stretch 3 reps;30 seconds   Gastroc Stretch 3 reps;30 seconds          PT Education - 09/28/14 1358    Education provided Yes   Education Details Continue HEP, maintain flexibility and strength to prevent recurrence.   Person(s) Educated Patient   Methods Explanation   Comprehension Verbalized understanding            PT Long Term Goals - 09/28/14 1357    PT LONG TERM GOAL #1   Title Pt will be I with advanced HEP   Time 4   Period Weeks   Status Achieved   PT LONG TERM GOAL #2   Title Pt. will report pain decrease to <5/10 first step in the AM   Time 4   Period Weeks   Status Achieved   PT  LONG TERM GOAL #3   Title Pt. will report ability to walk at least 1 mile with pain <2/10   Period Weeks   Status Achieved          Plan - 09/28/14 1400    Clinical Impression Statement All LTGs Met.    PT Plan discharge        Problem List There are no active problems to display for this patient.  Hessie Diener, PTA 09/28/2014 2:01 PM Phone: [phone] Fax: [phone]   PHYSICAL THERAPY DISCHARGE SUMMARY  Visits from Start of Care: 8  Current functional level related to goals / functional outcomes: See above for goals met   Remaining deficits: Mild weakness, balance impaired in SLS which pt. Reports as previous to injury   Education / Equipment: Ionto, RICE, HEP Plan: Patient agrees to discharge.  Patient goals were met. Patient is being discharged due to meeting the stated rehab goals.  ?????    Raeford Razor, PT 09/29/2014 2:47 PM  Phone: [phone] Fax: [phone]

## 2014-10-05 ENCOUNTER — Encounter: Payer: BC Managed Care – PPO | Admitting: Physical Therapy

## 2016-10-15 ENCOUNTER — Telehealth: Payer: Self-pay | Admitting: Hematology & Oncology

## 2016-10-15 NOTE — Telephone Encounter (Signed)
Pt called back to schedule hem appt. [place] [facility]

## 2016-11-14 ENCOUNTER — Encounter: Payer: Self-pay | Admitting: Hematology & Oncology

## 2016-11-14 ENCOUNTER — Other Ambulatory Visit: Payer: Self-pay

## 2016-11-14 ENCOUNTER — Ambulatory Visit: Payer: Self-pay

## 2016-11-15 ENCOUNTER — Ambulatory Visit: Payer: Self-pay | Admitting: Hematology & Oncology

## 2016-11-15 ENCOUNTER — Ambulatory Visit: Payer: Self-pay

## 2016-11-15 ENCOUNTER — Other Ambulatory Visit: Payer: Self-pay

## 2016-11-16 ENCOUNTER — Other Ambulatory Visit (HOSPITAL_BASED_OUTPATIENT_CLINIC_OR_DEPARTMENT_OTHER): Payer: Managed Care, Other (non HMO)

## 2016-11-16 ENCOUNTER — Ambulatory Visit (HOSPITAL_BASED_OUTPATIENT_CLINIC_OR_DEPARTMENT_OTHER): Payer: Managed Care, Other (non HMO) | Admitting: Family

## 2016-11-16 ENCOUNTER — Ambulatory Visit: Payer: Managed Care, Other (non HMO)

## 2016-11-16 ENCOUNTER — Other Ambulatory Visit: Payer: Self-pay | Admitting: Family

## 2016-11-16 VITALS — BP 119/63 | HR 98 | Temp 98.5°F | Resp 18 | Wt 210.0 lb

## 2016-11-16 DIAGNOSIS — R0602 Shortness of breath: Secondary | ICD-10-CM

## 2016-11-16 DIAGNOSIS — D72829 Elevated white blood cell count, unspecified: Secondary | ICD-10-CM | POA: Diagnosis not present

## 2016-11-16 DIAGNOSIS — D72823 Leukemoid reaction: Secondary | ICD-10-CM

## 2016-11-16 LAB — CBC WITH DIFFERENTIAL (CANCER CENTER ONLY)
BASO#: 0 10*3/uL (ref 0.0–0.2)
BASO%: 0.2 % (ref 0.0–2.0)
EOS ABS: 0.2 10*3/uL (ref 0.0–0.5)
EOS%: 1.1 % (ref 0.0–7.0)
HEMATOCRIT: 42.1 % (ref 34.8–46.6)
HGB: 14.1 g/dL (ref 11.6–15.9)
LYMPH#: 3.1 10*3/uL (ref 0.9–3.3)
LYMPH%: 23.7 % (ref 14.0–48.0)
MCH: 31.5 pg (ref 26.0–34.0)
MCHC: 33.5 g/dL (ref 32.0–36.0)
MCV: 94 fL (ref 81–101)
MONO#: 0.7 10*3/uL (ref 0.1–0.9)
MONO%: 5.4 % (ref 0.0–13.0)
NEUT#: 9.2 10*3/uL — ABNORMAL HIGH (ref 1.5–6.5)
NEUT%: 69.6 % (ref 39.6–80.0)
PLATELETS: 359 10*3/uL (ref 145–400)
RBC: 4.47 10*6/uL (ref 3.70–5.32)
RDW: 12.7 % (ref 11.1–15.7)
WBC: 13.2 10*3/uL — AB (ref 3.9–10.0)

## 2016-11-16 LAB — TECHNOLOGIST REVIEW CHCC SATELLITE

## 2016-11-16 LAB — CHCC SATELLITE - SMEAR

## 2016-11-16 NOTE — Progress Notes (Signed)
Hematology/Oncology Consultation   Name: Meghan Mcpherson      MRN: [id number]    Location: Room/bed info not found  Date: 11/16/2016 Time:3:42 PM   REFERRING PHYSICIAN: Gildardo Cranker, MD  REASON FOR CONSULT:  Elevated WBC count   DIAGNOSIS: Medication induced leukocytosis  HISTORY OF PRESENT ILLNESS: Meghan Mcpherson is a very pleasant 46 yo white female with history of leukocytosis. She has had no issue with infections. She has occasional fatigue but attributes this to some of her medications. She is on lithium at this time as well as multiple other mood stabilizing drugs. She sees Dr. Jordan Likes with [facility] every 3 months. She is hoping to be able to start weaning off the Lithium at her next appointment.   No fever, chills, n/v, cough, rash, dizziness, chest pain, palpitations, abdominal pain or changes in bowel or bladder habits.  She occasional SOB with exertion that is mild Meghan tolerable. She will take time to rest as needed.  She has bouts with constipation Meghan takes fiber regularly.  No swelling, tenderness, numbness or tingling in her extremities. She has generalized arthritic pain that bothers her from time to time. No new aches or pains.  She has maintained a good appetite Meghan is staying well hydrated. Her weight is stable. No unexplained loss or gain.  She is on Aygestin for endometriosis Meghan no longer has a cycle. She has no children Meghan history of 1 miscarriage at 7 weeks.  No family history of leukocytosis.  No personal cancer history. Family cancer history includes: father - pancreatic, mother - breast, 2 maternal aunts - breast Meghan 1 maternal aunt - cervical.  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 She denies any exposure to chemical or environmental toxins.  She quit smoking in November 2017, at that time she had been a smoker for 8 years. She does not drink alcohol.   ROS: All other 10 point review of systems is negative.   PAST MEDICAL HISTORY:   No past medical  history on file.  ALLERGIES: Not on File    MEDICATIONS:  Current Outpatient Prescriptions on File Prior to Visit  Medication Sig Dispense Refill  . buPROPion (WELLBUTRIN SR) 150 MG 12 hr tablet Take 150 mg by mouth 2 (two) times daily.    Marland Kitchen lamoTRIgine (LAMICTAL) 150 MG tablet Take 150 mg by mouth daily.    Marland Kitchen lithium 300 MG tablet Take 300 mg by mouth 3 (three) times daily.    . norethindrone (AYGESTIN) 5 MG tablet Take by mouth daily.    . pantoprazole (PROTONIX) 20 MG tablet Take 20 mg by mouth daily.    . traZODone (DESYREL) 100 MG tablet Take 100 mg by mouth at bedtime.    . ALPRAZolam (XANAX) 0.25 MG tablet Take 0.25 mg by mouth at bedtime as needed for anxiety.    . DULoxetine (CYMBALTA) 60 MG capsule Take 60 mg by mouth daily.    Marland Kitchen OLANZapine (ZYPREXA) 10 MG tablet Take 10 mg by mouth at bedtime.    . propranolol (INDERAL) 40 MG tablet Take 40 mg by mouth 3 (three) times daily.    Marland Kitchen topiramate (TOPAMAX) 50 MG tablet Take 50 mg by mouth 2 (two) times daily.     No current facility-administered medications on file prior to visit.      PAST SURGICAL HISTORY No past surgical history on file.  FAMILY HISTORY: No family history on file.  SOCIAL HISTORY:  has no tobacco, alcohol,  Meghan drug history on file.  PERFORMANCE STATUS: The patient's performance status is 0 - Asymptomatic  PHYSICAL EXAM: Most Recent Vital Signs: Blood pressure 119/63, pulse 98, temperature 98.5 F (36.9 C), temperature source Oral, resp. rate 18, weight 210 lb (95.3 kg). BP 119/63 (BP Location: Left Arm, Patient Position: Sitting)   Pulse 98   Temp 98.5 F (36.9 C) (Oral)   Resp 18   Wt 210 lb (95.3 kg)   General Appearance:    Alert, cooperative, no distress, appears stated age  Head:    Normocephalic, without obvious abnormality, atraumatic  Eyes:    PERRL, conjunctiva/corneas clear, EOM's intact, fundi    benign, both eyes        Throat:   Lips, mucosa, Meghan tongue normal; teeth Meghan gums  normal  Neck:   Supple, symmetrical, trachea midline, no adenopathy;    thyroid:  no enlargement/tenderness/nodules; no carotid   bruit or JVD  Back:     Symmetric, no curvature, ROM normal, no CVA tenderness  Lungs:     Clear to auscultation bilaterally, respirations unlabored  Chest Wall:    No tenderness or deformity   Heart:    Regular rate Meghan rhythm, S1 Meghan S2 normal, no murmur, rub   or gallop     Abdomen:     Soft, non-tender, bowel sounds active all four quadrants,    no masses, no organomegaly        Extremities:   Extremities normal, atraumatic, no cyanosis or edema  Pulses:   2+ Meghan symmetric all extremities  Skin:   Skin color, texture, turgor normal, no rashes or lesions  Lymph nodes:   Cervical, supraclavicular, Meghan axillary nodes normal  Neurologic:   CNII-XII intact, normal strength, sensation Meghan reflexes    Throughout, she has a slight tremor     LABORATORY DATA:  Results for orders placed or performed in visit on 11/16/16 (from the past 48 hour(s))  CBC with Differential [facility])     Status: Abnormal   Collection Time: 11/16/16  2:11 PM  Result Value Ref Range   WBC 13.2 (H) 3.9 - 10.0 10e3/uL   RBC 4.47 3.70 - 5.32 10e6/uL   HGB 14.1 11.6 - 15.9 g/dL   HCT 16.1 09.6 - 04.5 %   MCV 94 81 - 101 fL   MCH 31.5 26.0 - 34.0 pg   MCHC 33.5 32.0 - 36.0 g/dL   RDW 40.9 81.1 - 91.4 %   Platelets 359 145 - 400 10e3/uL   NEUT# 9.2 (H) 1.5 - 6.5 10e3/uL   LYMPH# 3.1 0.9 - 3.3 10e3/uL   MONO# 0.7 0.1 - 0.9 10e3/uL   Eosinophils Absolute 0.2 0.0 - 0.5 10e3/uL   BASO# 0.0 0.0 - 0.2 10e3/uL   NEUT% 69.6 39.6 - 80.0 %   LYMPH% 23.7 14.0 - 48.0 %   MONO% 5.4 0.0 - 13.0 %   EOS% 1.1 0.0 - 7.0 %   BASO% 0.2 0.0 - 2.0 %  [facility] - Smear     Status: None   Collection Time: 11/16/16  2:11 PM  Result Value Ref Range   Smear Result Smear Available   TECHNOLOGIST REVIEW [facility]     Status: None   Collection Time: 11/16/16  2:11 PM  Result Value Ref  Range   Tech Review Rare Metas Meghan Myelocytes present       RADIOGRAPHY: No results found.     PATHOLOGY: None  ASSESSMENT/PLAN: Ms. Tow is  a very pleasant 46 yo white female with history of leukocytosis. She is on quite a few medications including Lithium which can cause some leukocytosis. No anemia. Platelet count is 359. She has had no issue with infections. Her exam today was unremarkable Meghan she appears to be doing quite well. She states that she hopes that at her next appointment [facility], she hopes to start weaning off of the Lithium.  Her smear, viewed by Dr. Myna HidalgoEnnever, showed no evidence of abnormality or malignancy.  At this point we will not need to see Mrs. Kynard back in our office.  All questions were answered. She will contact our office for any future hematologic problems, questions or concerns. We can certainly   The patient was discussed with Meghan also seen by Dr. Myna HidalgoEnnever Meghan he is in agreement with the aforementioned.    [facility],Demara Lover M   Addendum:  I saw Meghan examined the patient with Glendi Mohiuddin. I looked at her blood smear. With her blood smear, she had mature white blood cells. Her white blood cell count was mildly elevated. She did not have immature myeloid cells. I saw no blasts. Her red cells Meghan platelets were okay.  I think the leukocytosis is secondary to lithium. I do not see anything that looks like a myeloproliferative disorder. I do not see anything that looks like leukemia.  Her physical exam was relatively unremarkable remarkable. There is no lymphadenopathy. There is no splenomegaly.  We reassured Ms. Uvaldo RisingMcNeil that there was no indication of a hematologic disorder that we would have to treat.  As nice as she is, I just do not think that we have to get her back to the office. I do still think we would be impacting her health care.  We spent about 40 minutes with her. We answered all of her questions.  Christin BachPete Ennever, MD

## 2016-11-19 LAB — LACTATE DEHYDROGENASE: LDH: 210 U/L (ref 125–245)

## 2016-11-22 NOTE — Progress Notes (Signed)
This encounter was created in error - please disregard.

## 2017-06-04 ENCOUNTER — Telehealth (INDEPENDENT_AMBULATORY_CARE_PROVIDER_SITE_OTHER): Payer: Self-pay | Admitting: *Deleted

## 2017-06-04 ENCOUNTER — Ambulatory Visit (INDEPENDENT_AMBULATORY_CARE_PROVIDER_SITE_OTHER): Payer: Self-pay | Admitting: Orthopedic Surgery

## 2017-06-04 NOTE — Telephone Encounter (Signed)
Called pt to let her know Dr. Lajoyce Cornersuda would be late today, he is in emergency surgery. Called to ask if she wanted to reschedule.

## 2017-06-06 ENCOUNTER — Ambulatory Visit (INDEPENDENT_AMBULATORY_CARE_PROVIDER_SITE_OTHER): Payer: Managed Care, Other (non HMO) | Admitting: Orthopedic Surgery

## 2017-06-06 DIAGNOSIS — M170 Bilateral primary osteoarthritis of knee: Secondary | ICD-10-CM

## 2017-06-06 DIAGNOSIS — S93412A Sprain of calcaneofibular ligament of left ankle, initial encounter: Secondary | ICD-10-CM

## 2017-06-06 DIAGNOSIS — M461 Sacroiliitis, not elsewhere classified: Secondary | ICD-10-CM | POA: Diagnosis not present

## 2017-06-06 MED ORDER — PREDNISONE 10 MG PO TABS: 10.0000 mg | ORAL_TABLET | Freq: Every day | ORAL | 0 refills | Status: DC

## 2017-06-06 NOTE — Progress Notes (Signed)
   Office Visit Note   Patient: Meghan Mcpherson           Date of Birth: 11/21/1970           MRN: [id number] Visit Date: 06/06/2017              Requested by: No referring provider defined for this encounter. PCP: Daisy Florooss, Charles Alan, MD  No chief complaint on file.     HPI: Patient presents with 3 separate issues #1 she's been having increasing bilateral knee pain with no mechanical symptoms no swelling patient has pain over the sacroiliac joint on the right and also complains of acute sprain of her left ankle radiographs were obtained but the disc she presented with does not have any images on it.  Assessment & Plan: Visit Diagnoses:  1. Sacroiliitis, not elsewhere classified (HCC)   2. Bilateral primary osteoarthritis of knee   3. Sprain of calcaneofibular ligament of left ankle, initial encounter     Plan: Recommended wearing the fracture boot for 2 weeks for the left ankle sprain recommended low-dose prednisone 10 mg with breakfast for the inflammation sacroiliac joint on the right she will wean off this once his symptoms resolve. Recommended strengthening for both knees. Discussed possibility of injection if they get worse.  Follow-Up Instructions: Return if symptoms worsen or fail to improve.   Ortho Exam  Patient is alert, oriented, no adenopathy, well-dressed, normal affect, normal respiratory effort. Examination patient has an antalgic gait. Examination the left ankle the tibia and fibular nontender to palpation. The deltoid ligament is nontender to palpation she is point tender to palpation over the anterior talofibular ligament. The anterior drawer is stable no ligamentous laxity. Examination of both knees she has mild crepitation with range of motion there is no effusion she has global tenderness to palpation around both knees. Examination of her right lower extremity she has negative straight leg raise no pain with range of motion of the hip. She is point tender to  palpation over the sacroiliac joint.  Imaging: No results found.  Labs: No results found for: HGBA1C, ESRSEDRATE, CRP, LABURIC, REPTSTATUS, GRAMSTAIN, CULT, LABORGA  Orders:  No orders of the defined types were placed in this encounter.  Meds ordered this encounter  Medications  . predniSONE (DELTASONE) 10 MG tablet    Sig: Take 1 tablet (10 mg total) by mouth daily with breakfast.    Dispense:  30 tablet    Refill:  0     Procedures: No procedures performed  Clinical Data: No additional findings.  ROS:  All other systems negative, except as noted in the HPI. Review of Systems  Objective: Vital Signs: There were no vitals taken for this visit.  Specialty Comments:  No specialty comments available.  PMFS History: There are no active problems to display for this patient.  No past medical history on file.  No family history on file.  No past surgical history on file. Social History   Occupational History  . Not on file.   Social History Main Topics  . Smoking status: Not on file  . Smokeless tobacco: Not on file  . Alcohol use Not on file  . Drug use: Unknown  . Sexual activity: Not on file

## 2018-12-25 ENCOUNTER — Ambulatory Visit (INDEPENDENT_AMBULATORY_CARE_PROVIDER_SITE_OTHER): Payer: BC Managed Care – PPO

## 2018-12-25 ENCOUNTER — Ambulatory Visit (INDEPENDENT_AMBULATORY_CARE_PROVIDER_SITE_OTHER): Payer: BC Managed Care – PPO | Admitting: Orthopedic Surgery

## 2018-12-25 ENCOUNTER — Encounter (INDEPENDENT_AMBULATORY_CARE_PROVIDER_SITE_OTHER): Payer: Self-pay | Admitting: Orthopedic Surgery

## 2018-12-25 VITALS — Ht 68.5 in | Wt 181.0 lb

## 2018-12-25 DIAGNOSIS — M170 Bilateral primary osteoarthritis of knee: Secondary | ICD-10-CM

## 2018-12-25 DIAGNOSIS — M25561 Pain in right knee: Secondary | ICD-10-CM

## 2018-12-29 ENCOUNTER — Encounter (INDEPENDENT_AMBULATORY_CARE_PROVIDER_SITE_OTHER): Payer: Self-pay | Admitting: Orthopedic Surgery

## 2018-12-29 DIAGNOSIS — M170 Bilateral primary osteoarthritis of knee: Secondary | ICD-10-CM

## 2018-12-29 MED ORDER — METHYLPREDNISOLONE ACETATE 40 MG/ML IJ SUSP
40.0000 mg | INTRAMUSCULAR | Status: AC | PRN
  Administered 2018-12-29: 40 mg via INTRA_ARTICULAR

## 2018-12-29 MED ORDER — LIDOCAINE HCL 1 % IJ SOLN
5.0000 mL | INTRAMUSCULAR | Status: AC | PRN
  Administered 2018-12-29: 5 mL

## 2018-12-29 NOTE — Progress Notes (Signed)
Office Visit Note   Patient: Meghan Mcpherson           Date of Birth: 05-Apr-1971           MRN: [id number] Visit Date: 12/25/2018              Requested by: Daisy Floro, MD [address] [place], [place] [address] PCP: Daisy Floro, MD  Chief Complaint  Patient presents with  . Right Leg - Pain  . Right Knee - Pain      HPI: Patient is a 48 year old woman status post previous multitrauma she has traumatic arthritis of the right knee she has been having increasing pain over the past 2-1/2 weeks with stabbing pain and swelling.  She complains of pain in the lateral aspect of the patellofemoral joint she has been using Celebrex.  Patient states she does smoke a pack a day does not drink.  Assessment & Plan: Visit Diagnoses:  1. Bilateral primary osteoarthritis of knee   2. Right knee pain, unspecified chronicity     Plan: The right knee was injected will reevaluate as needed.  Recommended quad isometric straight leg raises to help with the patellofemoral strength.  Follow-Up Instructions: Return if symptoms worsen or fail to improve.   Ortho Exam  Patient is alert, oriented, no adenopathy, well-dressed, normal affect, normal respiratory effort. Examination patient has crepitation of the patellofemoral joint with range of motion collaterals or cruciates are stable there is no effusion she is tender to palpation over the medial and lateral joint lines there is no redness no cellulitis.  Imaging: No results found. No images are attached to the encounter.  Labs: No results found for: HGBA1C, ESRSEDRATE, CRP, LABURIC, REPTSTATUS, GRAMSTAIN, CULT, LABORGA   Lab Results  Component Value Date   ALBUMIN 3.3 (L) 03/22/2009   ALBUMIN 3.5 03/20/2009    Body mass index is 27.12 kg/m.  Orders:  Orders Placed This Encounter  Procedures  . XR Knee 1-2 Views Right   No orders of the defined types were placed in this encounter.    Procedures: Large Joint Inj:  R knee on 12/29/2018 1:17 PM Indications: pain and diagnostic evaluation Details: 22 G 1.5 in needle, anteromedial approach  Arthrogram: No  Medications: 5 mL lidocaine 1 %; 40 mg methylPREDNISolone acetate 40 MG/ML Outcome: tolerated well, no immediate complications Procedure, treatment alternatives, risks and benefits explained, specific risks discussed. Consent was given by the patient. Immediately prior to procedure a time out was called to verify the correct patient, procedure, equipment, support staff and site/side marked as required. Patient was prepped and draped in the usual sterile fashion.      Clinical Data: No additional findings.  ROS:  All other systems negative, except as noted in the HPI. Review of Systems  Objective: Vital Signs: Ht 5' 8.5" (1.74 m)   Wt 181 lb (82.1 kg)   BMI 27.12 kg/m   Specialty Comments:  No specialty comments available.  PMFS History: There are no active problems to display for this patient.  History reviewed. No pertinent past medical history.  History reviewed. No pertinent family history.  History reviewed. No pertinent surgical history. Social History   Occupational History  . Not on file  Tobacco Use  . Smoking status: Current Every Day Smoker  . Smokeless tobacco: Never Used  Substance and Sexual Activity  . Alcohol use: Not Currently  . Drug use: Not Currently  . Sexual activity: Not Currently

## 2019-03-02 ENCOUNTER — Other Ambulatory Visit: Payer: Self-pay | Admitting: Family Medicine

## 2019-03-02 ENCOUNTER — Ambulatory Visit (INDEPENDENT_AMBULATORY_CARE_PROVIDER_SITE_OTHER): Payer: BC Managed Care – PPO

## 2019-03-02 ENCOUNTER — Other Ambulatory Visit: Payer: Self-pay

## 2019-03-02 DIAGNOSIS — R10819 Abdominal tenderness, unspecified site: Secondary | ICD-10-CM | POA: Diagnosis not present

## 2019-03-02 DIAGNOSIS — R109 Unspecified abdominal pain: Secondary | ICD-10-CM

## 2019-03-02 DIAGNOSIS — N3090 Cystitis, unspecified without hematuria: Secondary | ICD-10-CM

## 2019-03-02 MED ORDER — IOPAMIDOL (ISOVUE-300) INJECTION 61%
100.0000 mL | Freq: Once | INTRAVENOUS | Status: AC | PRN
  Administered 2019-03-02: 100 mL via INTRAVENOUS

## 2019-10-12 ENCOUNTER — Ambulatory Visit: Payer: BC Managed Care – PPO | Admitting: Orthopedic Surgery

## 2019-10-19 ENCOUNTER — Ambulatory Visit: Payer: BC Managed Care – PPO | Admitting: Orthopedic Surgery

## 2019-10-29 ENCOUNTER — Ambulatory Visit: Payer: BC Managed Care – PPO | Admitting: Orthopedic Surgery

## 2019-11-09 ENCOUNTER — Encounter: Payer: Self-pay | Admitting: Orthopedic Surgery

## 2019-11-09 ENCOUNTER — Ambulatory Visit: Payer: BC Managed Care – PPO | Admitting: Orthopedic Surgery

## 2019-11-09 ENCOUNTER — Other Ambulatory Visit: Payer: Self-pay

## 2019-11-09 VITALS — Ht 68.5 in | Wt 181.0 lb

## 2019-11-09 DIAGNOSIS — M170 Bilateral primary osteoarthritis of knee: Secondary | ICD-10-CM | POA: Diagnosis not present

## 2019-11-09 DIAGNOSIS — M5410 Radiculopathy, site unspecified: Secondary | ICD-10-CM

## 2019-11-09 MED ORDER — PREDNISONE 10 MG PO TABS: 20.0000 mg | ORAL_TABLET | Freq: Every day | ORAL | 0 refills | Status: DC

## 2019-11-09 NOTE — Progress Notes (Signed)
Office Visit Note   Patient: Meghan Mcpherson           Date of Birth: Sep 24, 1971           MRN: [id number] Visit Date: 11/09/2019              Requested by: Lawerance Cruel, [place],  [place] [address] PCP: Lawerance Cruel, MD  Chief Complaint  Patient presents with  . Right Knee - Pain    S/p Cortisone Inj 12/25/2018      HPI: Patient is a 49 year old woman who presents complaining of generalized right knee pain she states it does not feel mechanical.  She complains of bilateral buttocks pain worse on the right than the left she states it is painful all the time pain when she tries to sleep at night pain with driving.  She states her leg feels like it is tired of functioning.  Assessment & Plan: Visit Diagnoses:  1. Bilateral primary osteoarthritis of knee   2. Back pain with right-sided radiculopathy     Plan: Discussed that her symptoms seem to be consistent with sciatica down the right lower extremity.  Will start on prednisone 20 mg with breakfast in the morning will reevaluate in 2 weeks.  Discussed that she may need a MRI scan for evaluation for epidural steroid injection.  Follow-Up Instructions: Return in about 2 weeks (around 11/23/2019).   Ortho Exam  Patient is alert, oriented, no adenopathy, well-dressed, normal affect, normal respiratory effort. Examination patient has a normal gait she has no pain with range of motion of the hip knee or ankle.  She has negative straight leg raise.  No motor weakness.  Review of her CT scan shows a congruent hip joint bilaterally.  Lumbar spine shows some mild degenerative changes on the CT scan.  Radiograph of the right knee shows some changes in the distal femur from the intramedullary nail.  Review of the bone scan shows no evidence of infection around the right intramedullary nail increased uptake in the right knee consistent with inflammation from degenerative arthritis.  Imaging: No results found. No  images are attached to the encounter.  Labs: No results found for: HGBA1C, ESRSEDRATE, CRP, LABURIC, REPTSTATUS, GRAMSTAIN, CULT, LABORGA   Lab Results  Component Value Date   ALBUMIN 3.3 (L) 03/22/2009   ALBUMIN 3.5 03/20/2009    No results found for: MG No results found for: VD25OH  No results found for: PREALBUMIN CBC EXTENDED Latest Ref Rng & Units 11/16/2016 08/02/2010 03/24/2009  WBC 3.9 - 10.0 10e3/uL 13.2(H) 12.9(H) 10.1  RBC 3.70 - 5.32 10e6/uL 4.47 4.33 4.23  HGB 11.6 - 15.9 g/dL 14.1 13.4 12.9  HCT 34.8 - 46.6 % 42.1 38.8 37.7  PLT 145 - 400 10e3/uL 359 312 286  NEUTROABS 1.5 - 6.5 10e3/uL 9.2(H) 8.9(H) 6.7  LYMPHSABS 0.9 - 3.3 10e3/uL 3.1 3.3 2.3     Body mass index is 27.12 kg/m.  Orders:  No orders of the defined types were placed in this encounter.  Meds ordered this encounter  Medications  . predniSONE (DELTASONE) 10 MG tablet    Sig: Take 2 tablets (20 mg total) by mouth daily with breakfast.    Dispense:  60 tablet    Refill:  0     Procedures: No procedures performed  Clinical Data: No additional findings.  ROS:  All other systems negative, except as noted in the HPI. Review of Systems  Objective: Vital Signs:  Ht 5' 8.5" (1.74 m)   Wt 181 lb (82.1 kg)   BMI 27.12 kg/m   Specialty Comments:  No specialty comments available.  PMFS History: There are no problems to display for this patient.  History reviewed. No pertinent past medical history.  History reviewed. No pertinent family history.  History reviewed. No pertinent surgical history. Social History   Occupational History  . Not on file  Tobacco Use  . Smoking status: Current Every Day Smoker  . Smokeless tobacco: Never Used  Substance and Sexual Activity  . Alcohol use: Not Currently  . Drug use: Not Currently  . Sexual activity: Not Currently

## 2019-11-23 ENCOUNTER — Ambulatory Visit: Payer: BC Managed Care – PPO | Admitting: Orthopedic Surgery

## 2019-11-30 ENCOUNTER — Encounter: Payer: Self-pay | Admitting: Orthopedic Surgery

## 2019-11-30 ENCOUNTER — Ambulatory Visit: Payer: BC Managed Care – PPO | Admitting: Orthopedic Surgery

## 2019-11-30 ENCOUNTER — Other Ambulatory Visit: Payer: Self-pay

## 2019-11-30 VITALS — Ht 68.0 in | Wt 181.0 lb

## 2019-11-30 DIAGNOSIS — M25561 Pain in right knee: Secondary | ICD-10-CM

## 2019-11-30 NOTE — Progress Notes (Signed)
Office Visit Note   Patient: Meghan Mcpherson           Date of Birth: 01-Jun-1971           MRN: [id number] Visit Date: 11/30/2019              Requested by: Daisy Floro, MD [address] [place],  [place] [address] PCP: Daisy Floro, MD  Chief Complaint  Patient presents with  . Right Leg - Pain      HPI: Patient is a 49 year old woman who has multiple areas of pain around the right knee.  Patient has completed a course of prednisone without help.  She complains of pain in the distal femur at the location of the distal aspect of the femoral nail she also complains of pain over the insertion of the MCL and has pain and crepitation in the patellofemoral joint with range of motion.  Patient recently had an injection at the insertion of the [facility] and this seemed to relieve some of her symptoms.  Assessment & Plan: Visit Diagnoses:  1. Right knee pain, unspecified chronicity     Plan: Patient was given a prescription for physical therapy for quad and VMO strengthening.  I feel with fascial strengthening patient's knee should improve.  Follow-Up Instructions: Return in about 4 weeks (around 12/28/2019).   Ortho Exam  Patient is alert, oriented, no adenopathy, well-dressed, normal affect, normal respiratory effort. Examination patient has crepitation of the patellofemoral joint with range of motion the medial and lateral joint line are not tender to palpation.  Valgus stress reproduces pain at the insertion of the MCL.  The pes anserine [place] bursal area is not tender to palpation.  The patient is tender to palpation in the patellofemoral joint with crepitation with range of motion.  Examination with patient standing her leg length is equal.  The bone scan of the right femur shows arthritic changes of the knee and does not show any problems with the hardware no signs of infection.  Imaging: No results found. No images are attached to the encounter.  Labs: No results  found for: HGBA1C, ESRSEDRATE, CRP, LABURIC, REPTSTATUS, GRAMSTAIN, CULT, LABORGA   Lab Results  Component Value Date   ALBUMIN 3.3 (L) 03/22/2009   ALBUMIN 3.5 03/20/2009    No results found for: MG No results found for: VD25OH  No results found for: PREALBUMIN CBC EXTENDED Latest Ref Rng & Units 11/16/2016 08/02/2010 03/24/2009  WBC 3.9 - 10.0 10e3/uL 13.2(H) 12.9(H) 10.1  RBC 3.70 - 5.32 10e6/uL 4.47 4.33 4.23  HGB 11.6 - 15.9 g/dL 95.2 84.1 32.4  HCT 40.1 - 46.6 % 42.1 38.8 37.7  PLT 145 - 400 10e3/uL 359 312 286  NEUTROABS 1.5 - 6.5 10e3/uL 9.2(H) 8.9(H) 6.7  LYMPHSABS 0.9 - 3.3 10e3/uL 3.1 3.3 2.3     Body mass index is 27.52 kg/m.  Orders:  No orders of the defined types were placed in this encounter.  No orders of the defined types were placed in this encounter.    Procedures: No procedures performed  Clinical Data: No additional findings.  ROS:  All other systems negative, except as noted in the HPI. Review of Systems  Objective: Vital Signs: Ht 5\' 8"  (1.727 m)   Wt 181 lb (82.1 kg)   BMI 27.52 kg/m   Specialty Comments:  No specialty comments available.  PMFS History: There are no problems to display for this patient.  History reviewed. No pertinent past medical  history.  History reviewed. No pertinent family history.  History reviewed. No pertinent surgical history. Social History   Occupational History  . Not on file  Tobacco Use  . Smoking status: Current Every Day Smoker  . Smokeless tobacco: Never Used  Substance and Sexual Activity  . Alcohol use: Not Currently  . Drug use: Not Currently  . Sexual activity: Not Currently

## 2019-12-03 ENCOUNTER — Other Ambulatory Visit: Payer: Self-pay | Admitting: Orthopedic Surgery

## 2019-12-04 NOTE — Telephone Encounter (Signed)
Pt was evaluated on 11/09/19 for RLE sciatic and was given rx of prednisone at that time. Do you wish to refill?

## 2019-12-28 ENCOUNTER — Ambulatory Visit: Payer: BC Managed Care – PPO | Admitting: Orthopedic Surgery

## 2020-02-01 ENCOUNTER — Encounter: Payer: Self-pay | Admitting: Orthopedic Surgery

## 2020-02-01 ENCOUNTER — Other Ambulatory Visit: Payer: Self-pay

## 2020-02-01 ENCOUNTER — Ambulatory Visit: Payer: BC Managed Care – PPO | Admitting: Physician Assistant

## 2020-02-01 DIAGNOSIS — M25561 Pain in right knee: Secondary | ICD-10-CM

## 2020-02-01 NOTE — Progress Notes (Signed)
Office Visit Note   Patient: Meghan Mcpherson           Date of Birth: 1970-11-13           MRN: [id number] Visit Date: 02/01/2020              Requested by: Daisy Floro, MD [address] [place],  [place] [address] PCP: Daisy Floro, MD  Chief Complaint  Patient presents with  . Right Knee - Pain      HPI: This is a 49 year old woman who presents in follow-up today for her right knee pain.  She is status post intramedullary rodding of the femur fracture many many years ago.  At her last visit VMO strengthening was discussed.  She had already tried a course of oral prednisone.  Most of her pain was in the distal femur at the location of the distal aspect of the femoral nail.  She also had pain over the insertion of the MCL and pain and crepitation in the patellofemoral joint with range of motion x-rays reviewed at that time demonstrated some arthritic changes in the knee but did not show any problems with the hardware nor any signs of infection she states she has since undergone injections in the areas of the lateral scar on her distal femur as well as what appears to be into the knee joint.  Today she comes in complaining of no pain.  Assessment & Plan: Visit Diagnoses: No diagnosis found.  Plan: We did offer for her to stay and speak directly with Dr. Lajoyce Corners he asked her several times to speak with him she declined to be seen.  Dr. Lajoyce Corners is available to speak with her.  Follow-Up Instructions: No follow-ups on file.   Ortho Exam  Patient is alert, oriented, no adenopathy, well-dressed, normal affect, normal respiratory effort. Focused examination of the right knee there is no effusion.  No erythema.  Still some crepitation with patellofemoral joint on range of motion.  No tenderness over the lateral scar.  At the end of the exam she started crying and did not understand the positioning of the femoral rod as she had been told it was crooked.  She became very upset.  Dr.  Lajoyce Corners did offer to bring her back into her room and discuss this with her but she declined  Imaging: No results found. No images are attached to the encounter.  Labs: No results found for: HGBA1C, ESRSEDRATE, CRP, LABURIC, REPTSTATUS, GRAMSTAIN, CULT, LABORGA   Lab Results  Component Value Date   ALBUMIN 3.3 (L) 03/22/2009   ALBUMIN 3.5 03/20/2009    No results found for: MG No results found for: VD25OH  No results found for: PREALBUMIN CBC EXTENDED Latest Ref Rng & Units 11/16/2016 08/02/2010 03/24/2009  WBC 3.9 - 10.0 10e3/uL 13.2(H) 12.9(H) 10.1  RBC 3.70 - 5.32 10e6/uL 4.47 4.33 4.23  HGB 11.6 - 15.9 g/dL 19.1 47.8 29.5  HCT 62.1 - 46.6 % 42.1 38.8 37.7  PLT 145 - 400 10e3/uL 359 312 286  NEUTROABS 1.5 - 6.5 10e3/uL 9.2(H) 8.9(H) 6.7  LYMPHSABS 0.9 - 3.3 10e3/uL 3.1 3.3 2.3     There is no height or weight on file to calculate BMI.  Orders:  No orders of the defined types were placed in this encounter.  No orders of the defined types were placed in this encounter.    Procedures: No procedures performed  Clinical Data: No additional findings.  ROS:  All other systems  negative, except as noted in the HPI. Review of Systems  Objective: Vital Signs: There were no vitals taken for this visit.  Specialty Comments:  No specialty comments available.  PMFS History: There are no problems to display for this patient.  History reviewed. No pertinent past medical history.  History reviewed. No pertinent family history.  History reviewed. No pertinent surgical history. Social History   Occupational History  . Not on file  Tobacco Use  . Smoking status: Current Every Day Smoker  . Smokeless tobacco: Never Used  Substance and Sexual Activity  . Alcohol use: Not Currently  . Drug use: Not Currently  . Sexual activity: Not Currently

## 2020-02-03 ENCOUNTER — Telehealth: Payer: Self-pay

## 2020-02-03 NOTE — Telephone Encounter (Signed)
I called patient.  Her pain is lateral to the knee it does not appear consistent with the patellofemoral arthritis.  She has had an external fixator in this location 20 years ago but doubt her continuous pain in this area would be due to the scar tissue.  Her symptoms seem to be consistent with radicular pain.  Patient will set up a follow-up appointment with me we will obtain 2 view radiographs of the lumbar spine and anticipate possible MRI scan of the lumbar spine.

## 2020-02-03 NOTE — Telephone Encounter (Signed)
I called pt and apologized for the scheduling error. I let her know upon review when she called to reschedule her appt in march that it was rescheduled on the PA schedule. This was our error and wanted to know what I could do to correct the situation for her. She was thankful that I had reached out and said that she would like to speak with you as you are the only one that can answer her questions. I advised that you were in the operating room today but that I would let you know and that at some point during the later afternoon that you would call her. You can reach her at [phone]. Pt states that she was very upset but she felt much better knowing that you would speak with her and that we cared to reach out to her. Please let me know what I can do.

## 2020-02-09 ENCOUNTER — Ambulatory Visit: Payer: BC Managed Care – PPO | Admitting: Orthopedic Surgery

## 2020-02-15 ENCOUNTER — Ambulatory Visit: Payer: BC Managed Care – PPO | Admitting: Orthopedic Surgery

## 2020-02-22 ENCOUNTER — Ambulatory Visit: Payer: BC Managed Care – PPO | Admitting: Orthopedic Surgery

## 2020-02-29 ENCOUNTER — Ambulatory Visit: Payer: BC Managed Care – PPO | Admitting: Orthopedic Surgery

## 2020-03-08 ENCOUNTER — Ambulatory Visit: Payer: BC Managed Care – PPO | Admitting: Orthopedic Surgery

## 2021-03-13 ENCOUNTER — Other Ambulatory Visit: Payer: Self-pay | Admitting: Gastroenterology

## 2021-03-13 DIAGNOSIS — R1310 Dysphagia, unspecified: Secondary | ICD-10-CM

## 2021-04-04 ENCOUNTER — Other Ambulatory Visit: Payer: BC Managed Care – PPO

## 2021-04-14 ENCOUNTER — Other Ambulatory Visit: Payer: BC Managed Care – PPO

## 2021-04-24 ENCOUNTER — Ambulatory Visit
Admission: RE | Admit: 2021-04-24 | Discharge: 2021-04-24 | Disposition: A | Discharge status: Home / Self Care | Payer: BC Managed Care – PPO | Source: Ambulatory Visit | Attending: Gastroenterology | Admitting: Gastroenterology

## 2021-04-24 DIAGNOSIS — R1310 Dysphagia, unspecified: Secondary | ICD-10-CM

## 2021-07-24 ENCOUNTER — Ambulatory Visit
Admission: RE | Admit: 2021-07-24 | Discharge: 2021-07-24 | Disposition: A | Discharge status: Home / Self Care | Payer: BC Managed Care – PPO | Source: Ambulatory Visit | Attending: Home Modifications | Admitting: Home Modifications

## 2021-07-24 ENCOUNTER — Other Ambulatory Visit: Payer: Self-pay | Admitting: Home Modifications

## 2021-07-24 DIAGNOSIS — R079 Chest pain, unspecified: Secondary | ICD-10-CM

## 2021-07-24 DIAGNOSIS — R059 Cough, unspecified: Secondary | ICD-10-CM

## 2021-11-22 ENCOUNTER — Other Ambulatory Visit: Payer: Self-pay

## 2021-11-22 DIAGNOSIS — R1011 Right upper quadrant pain: Secondary | ICD-10-CM

## 2021-11-22 DIAGNOSIS — R110 Nausea: Secondary | ICD-10-CM

## 2021-11-30 ENCOUNTER — Other Ambulatory Visit: Payer: Self-pay

## 2021-11-30 ENCOUNTER — Ambulatory Visit
Admission: RE | Admit: 2021-11-30 | Discharge: 2021-11-30 | Disposition: A | Discharge status: Home / Self Care | Payer: BC Managed Care – PPO | Source: Ambulatory Visit | Attending: Gastroenterology | Admitting: Gastroenterology

## 2021-11-30 DIAGNOSIS — R110 Nausea: Secondary | ICD-10-CM

## 2021-11-30 DIAGNOSIS — R1011 Right upper quadrant pain: Secondary | ICD-10-CM

## 2021-12-07 ENCOUNTER — Other Ambulatory Visit: Payer: Self-pay | Admitting: Gastroenterology

## 2021-12-07 DIAGNOSIS — R9389 Abnormal findings on diagnostic imaging of other specified body structures: Secondary | ICD-10-CM

## 2021-12-15 ENCOUNTER — Other Ambulatory Visit: Payer: Self-pay

## 2021-12-15 ENCOUNTER — Ambulatory Visit
Admission: RE | Admit: 2021-12-15 | Discharge: 2021-12-15 | Disposition: A | Discharge status: Home / Self Care | Payer: BC Managed Care – PPO | Source: Ambulatory Visit | Attending: Gastroenterology | Admitting: Gastroenterology

## 2021-12-15 DIAGNOSIS — R9389 Abnormal findings on diagnostic imaging of other specified body structures: Secondary | ICD-10-CM

## 2021-12-20 ENCOUNTER — Other Ambulatory Visit: Payer: BC Managed Care – PPO

## 2021-12-25 ENCOUNTER — Ambulatory Visit
Admission: RE | Admit: 2021-12-25 | Discharge: 2021-12-25 | Disposition: A | Discharge status: Home / Self Care | Payer: BC Managed Care – PPO | Source: Ambulatory Visit | Attending: Family Medicine | Admitting: Family Medicine

## 2021-12-25 ENCOUNTER — Other Ambulatory Visit: Payer: Self-pay | Admitting: Family Medicine

## 2021-12-25 DIAGNOSIS — M25571 Pain in right ankle and joints of right foot: Secondary | ICD-10-CM

## 2022-05-30 ENCOUNTER — Ambulatory Visit: Payer: BC Managed Care – PPO | Admitting: Family

## 2022-05-30 ENCOUNTER — Encounter: Payer: Self-pay | Admitting: Family

## 2022-05-30 ENCOUNTER — Ambulatory Visit (INDEPENDENT_AMBULATORY_CARE_PROVIDER_SITE_OTHER): Payer: BC Managed Care – PPO

## 2022-05-30 DIAGNOSIS — G8929 Other chronic pain: Secondary | ICD-10-CM | POA: Diagnosis not present

## 2022-05-30 DIAGNOSIS — M25561 Pain in right knee: Secondary | ICD-10-CM

## 2022-05-30 DIAGNOSIS — M1711 Unilateral primary osteoarthritis, right knee: Secondary | ICD-10-CM

## 2022-06-01 NOTE — Progress Notes (Signed)
Office Visit Note   Patient: Meghan Mcpherson           Date of Birth: 11/19/1970           MRN: [id number] Visit Date: 05/30/2022              Requested by: Daisy Floro, MD [address] [place],  [place] [address] PCP: Daisy Floro, MD  Chief Complaint  Patient presents with   Right Knee - Pain      HPI: The patient is a 51 year old woman who presents today with concerns of chronic right knee pain.  This has been ongoing for many years she has global knee pain this is associated with intermittent swelling pain with ambulation especially ascending and descending stairs and inclines.  She has had loss of range of motion due to pain pain with flexion  She does have a history of IM rod for a femur fracture this was many many years ago she is concerned this may affect her ability to have a total knee replacement.  Relates she has seen several other orthopedic surgeons in hopes of having total knee arthroplasty they have declined due to the IM rod.  She is currently in pain management undergoing serial Depo-Medrol injections which are probably providing her with modest improvement of her knee pain  Assessment & Plan: Visit Diagnoses:  1. Chronic pain of right knee     Plan: Discussed with Dr. Lajoyce Corners.  He is willing to proceed with total knee arthroplasty on the right.  We will set this up at her convenience.  Follow-Up Instructions: No follow-ups on file.   Right Knee Exam   Muscle Strength  The patient has normal right knee strength.  Tenderness  The patient is experiencing tenderness in the medial joint line.  Range of Motion  The patient has normal right knee ROM.  Tests  Varus: negative Valgus: negative  Other  Effusion: no effusion present      Patient is alert, oriented, no adenopathy, well-dressed, normal affect, normal respiratory effort.   Imaging: No results found. No images are attached to the encounter.  Labs: No results found for:  "HGBA1C", "ESRSEDRATE", "CRP", "LABURIC", "REPTSTATUS", "GRAMSTAIN", "CULT", "LABORGA"   Lab Results  Component Value Date   ALBUMIN 3.3 (L) 03/22/2009   ALBUMIN 3.5 03/20/2009    No results found for: "MG" No results found for: "VD25OH"  No results found for: "PREALBUMIN"    Latest Ref Rng & Units 11/16/2016    2:11 PM 08/02/2010    2:50 PM 03/24/2009    5:10 AM  CBC EXTENDED  WBC 3.9 - 10.0 10e3/uL 13.2  12.9  10.1   RBC 3.70 - 5.32 10e6/uL 4.47  4.33  4.23   Hemoglobin 11.6 - 15.9 g/dL 85.2  77.8  24.2   HCT 34.8 - 46.6 % 42.1  38.8  37.7   Platelets 145 - 400 10e3/uL 359  312  286   NEUT# 1.5 - 6.5 10e3/uL 9.2  8.9  6.7   Lymph# 0.9 - 3.3 10e3/uL 3.1  3.3  2.3      There is no height or weight on file to calculate BMI.  Orders:  Orders Placed This Encounter  Procedures   XR Knee 1-2 Views Right   No orders of the defined types were placed in this encounter.    Procedures: No procedures performed  Clinical Data: No additional findings.  ROS:  All other systems negative, except as noted  in the HPI. Review of Systems  Objective: Vital Signs: There were no vitals taken for this visit.  Specialty Comments:  No specialty comments available.  PMFS History: There are no problems to display for this patient.  History reviewed. No pertinent past medical history.  History reviewed. No pertinent family history.  History reviewed. No pertinent surgical history. Social History   Occupational History   Not on file  Tobacco Use   Smoking status: Every Day   Smokeless tobacco: Never  Vaping Use   Vaping Use: Never used  Substance and Sexual Activity   Alcohol use: Not Currently   Drug use: Not Currently   Sexual activity: Not Currently

## 2022-06-15 ENCOUNTER — Other Ambulatory Visit (HOSPITAL_COMMUNITY): Payer: Self-pay | Admitting: Gastroenterology

## 2022-06-15 ENCOUNTER — Other Ambulatory Visit: Payer: Self-pay | Admitting: Gastroenterology

## 2022-06-15 DIAGNOSIS — R110 Nausea: Secondary | ICD-10-CM

## 2022-06-25 ENCOUNTER — Encounter (HOSPITAL_COMMUNITY): Payer: Self-pay

## 2022-06-25 ENCOUNTER — Encounter (HOSPITAL_COMMUNITY): Payer: BC Managed Care – PPO

## 2022-06-28 ENCOUNTER — Telehealth: Payer: Self-pay | Admitting: Orthopedic Surgery

## 2022-06-28 NOTE — Telephone Encounter (Signed)
I called Meghan Mcpherson to discuss scheduling her right knee replacement.  She states that her insurance just approved her to receive gel injections in her knees.  She is going to try that first.  If that does not work, she will get back in touch with me to schedule surgery.

## 2022-08-01 ENCOUNTER — Other Ambulatory Visit: Payer: Self-pay | Admitting: Gastroenterology

## 2022-08-22 ENCOUNTER — Ambulatory Visit: Payer: BC Managed Care – PPO | Admitting: Family

## 2022-08-29 ENCOUNTER — Ambulatory Visit (INDEPENDENT_AMBULATORY_CARE_PROVIDER_SITE_OTHER): Payer: BC Managed Care – PPO

## 2022-08-29 ENCOUNTER — Encounter: Payer: Self-pay | Admitting: Family

## 2022-08-29 ENCOUNTER — Ambulatory Visit: Payer: BC Managed Care – PPO | Admitting: Family

## 2022-08-29 DIAGNOSIS — M25571 Pain in right ankle and joints of right foot: Secondary | ICD-10-CM | POA: Diagnosis not present

## 2022-08-29 DIAGNOSIS — M1712 Unilateral primary osteoarthritis, left knee: Secondary | ICD-10-CM

## 2022-08-29 DIAGNOSIS — M25871 Other specified joint disorders, right ankle and foot: Secondary | ICD-10-CM

## 2022-08-29 DIAGNOSIS — M25562 Pain in left knee: Secondary | ICD-10-CM

## 2022-08-29 MED ORDER — LIDOCAINE HCL 1 % IJ SOLN
5.0000 mL | INTRAMUSCULAR | Status: AC | PRN
  Administered 2022-08-29: 5 mL

## 2022-08-29 MED ORDER — LIDOCAINE HCL 1 % IJ SOLN
2.0000 mL | INTRAMUSCULAR | Status: AC | PRN
  Administered 2022-08-29: 2 mL

## 2022-08-29 MED ORDER — METHYLPREDNISOLONE ACETATE 40 MG/ML IJ SUSP
40.0000 mg | INTRAMUSCULAR | Status: AC | PRN
  Administered 2022-08-29: 40 mg via INTRA_ARTICULAR

## 2022-08-29 NOTE — Progress Notes (Signed)
Office Visit Note   Patient: Meghan Mcpherson           Date of Birth: 12/25/1970           MRN: [id number] Visit Date: 08/29/2022              Requested by: Lawerance Cruel, [place],  [place] [address] PCP: Lawerance Cruel, MD  Chief Complaint  Patient presents with   Right Ankle - Pain      HPI: The patient is a 51 year old woman who presents today with 2 separate issues  She is complaining of right ankle pain this has been gradually worsening over the last 2 years.  This is deep ankle pain diffuse inside the ankle across anterior joint line only painful with weightbearing has been debilitating for the last 6 weeks she states it is unbearable she is beginning to walk with a limp and favor her left knee has begun hurting as well  The other issue is her left knee she has been having gradually worsening anterior knee pain this wraps across the front of her knee no locking catching giving way worse with weightbearing intermittent swelling  Assessment & Plan: Visit Diagnoses:  1. Acute pain of left knee   2. Pain in right ankle and joints of right foot     Plan: We will proceed with Depo-Medrol injection right ankle this will be diagnostic as well as therapeutic for her ankle impingement.  Depo-Medrol injection of the left knee patient tolerated well.  Follow-up as needed.  Follow-Up Instructions: No follow-ups on file.   Right Ankle Exam   Tenderness  Right ankle tenderness location: Anterior joint line, sinus Tarsi. Swelling: mild  Range of Motion  The patient has normal right ankle ROM.  Muscle Strength  The patient has normal right ankle strength.  Other  Pulse: present    Left Knee Exam   Muscle Strength  The patient has normal left knee strength.  Tenderness  The patient is experiencing tenderness in the medial joint line.  Range of Motion  The patient has normal left knee ROM.  Tests  Varus: negative Valgus: negative  Other   Effusion: no effusion present      Patient is alert, oriented, no adenopathy, well-dressed, normal affect, normal respiratory effort.   Imaging: No results found. No images are attached to the encounter.  Labs: No results found for: "HGBA1C", "ESRSEDRATE", "CRP", "LABURIC", "REPTSTATUS", "GRAMSTAIN", "CULT", "LABORGA"   Lab Results  Component Value Date   ALBUMIN 3.3 (L) 03/22/2009   ALBUMIN 3.5 03/20/2009    No results found for: "MG" No results found for: "VD25OH"  No results found for: "PREALBUMIN"    Latest Ref Rng & Units 11/16/2016    2:11 PM 08/02/2010    2:50 PM 03/24/2009    5:10 AM  CBC EXTENDED  WBC 3.9 - 10.0 10e3/uL 13.2  12.9  10.1   RBC 3.70 - 5.32 10e6/uL 4.47  4.33  4.23   Hemoglobin 11.6 - 15.9 g/dL 14.1  13.4  12.9   HCT 34.8 - 46.6 % 42.1  38.8  37.7   Platelets 145 - 400 10e3/uL 359  312  286   NEUT# 1.5 - 6.5 10e3/uL 9.2  8.9  6.7   Lymph# 0.9 - 3.3 10e3/uL 3.1  3.3  2.3      There is no height or weight on file to calculate BMI.  Orders:  Orders Placed This Encounter  Procedures   XR Ankle Complete Right   XR Knee 1-2 Views Left   No orders of the defined types were placed in this encounter.    Procedures: Large Joint Inj: L knee on 08/29/2022 10:22 AM Indications: pain Details: 18 G 1.5 in needle, anteromedial approach Medications: 5 mL lidocaine 1 %; 40 mg methylPREDNISolone acetate 40 MG/ML Consent was given by the patient.    Medium Joint Inj: R ankle on 08/29/2022 10:23 AM Indications: pain Details: 25 G needle, anterolateral approach Medications: 2 mL lidocaine 1 %; 40 mg methylPREDNISolone acetate 40 MG/ML Outcome: tolerated well, no immediate complications Consent was given by the patient. Patient was prepped and draped in the usual sterile fashion.      Clinical Data: No additional findings.  ROS:  All other systems negative, except as noted in the HPI. Review of Systems  Constitutional:  Negative for chills  and fever.  Musculoskeletal:  Positive for arthralgias, joint swelling and myalgias.    Objective: Vital Signs: There were no vitals taken for this visit.  Specialty Comments:  No specialty comments available.  PMFS History: There are no problems to display for this patient.  History reviewed. No pertinent past medical history.  History reviewed. No pertinent family history.  History reviewed. No pertinent surgical history. Social History   Occupational History   Not on file  Tobacco Use   Smoking status: Every Day   Smokeless tobacco: Never  Vaping Use   Vaping Use: Never used  Substance and Sexual Activity   Alcohol use: Not Currently   Drug use: Not Currently   Sexual activity: Not Currently

## 2022-10-10 ENCOUNTER — Encounter (HOSPITAL_COMMUNITY): Payer: Self-pay

## 2022-10-10 ENCOUNTER — Ambulatory Visit (HOSPITAL_COMMUNITY): Payer: BC Managed Care – PPO

## 2022-11-28 ENCOUNTER — Other Ambulatory Visit: Payer: Self-pay | Admitting: Family Medicine

## 2022-11-28 DIAGNOSIS — S82142S Displaced bicondylar fracture of left tibia, sequela: Secondary | ICD-10-CM

## 2022-11-28 DIAGNOSIS — Z1231 Encounter for screening mammogram for malignant neoplasm of breast: Secondary | ICD-10-CM

## 2022-12-28 ENCOUNTER — Other Ambulatory Visit: Payer: BC Managed Care – PPO

## 2023-01-02 ENCOUNTER — Other Ambulatory Visit: Payer: BC Managed Care – PPO

## 2023-01-02 ENCOUNTER — Other Ambulatory Visit: Payer: Self-pay | Admitting: Orthopedic Surgery

## 2023-01-04 ENCOUNTER — Other Ambulatory Visit: Payer: BC Managed Care – PPO

## 2023-01-15 ENCOUNTER — Ambulatory Visit
Admission: RE | Admit: 2023-01-15 | Discharge: 2023-01-15 | Disposition: A | Discharge status: Home / Self Care | Payer: BC Managed Care – PPO | Source: Ambulatory Visit | Attending: Family Medicine | Admitting: Family Medicine

## 2023-01-15 DIAGNOSIS — Z1231 Encounter for screening mammogram for malignant neoplasm of breast: Secondary | ICD-10-CM

## 2023-01-29 ENCOUNTER — Encounter (HOSPITAL_BASED_OUTPATIENT_CLINIC_OR_DEPARTMENT_OTHER): Payer: Self-pay | Admitting: Orthopedic Surgery

## 2023-02-05 ENCOUNTER — Ambulatory Visit (HOSPITAL_BASED_OUTPATIENT_CLINIC_OR_DEPARTMENT_OTHER)
Admission: RE | Admit: 2023-02-05 | Payer: BC Managed Care – PPO | Source: Home / Self Care | Admitting: Orthopedic Surgery

## 2023-02-05 ENCOUNTER — Encounter (HOSPITAL_BASED_OUTPATIENT_CLINIC_OR_DEPARTMENT_OTHER): Admission: RE | Payer: Self-pay | Source: Home / Self Care

## 2023-02-05 HISTORY — DX: Bipolar disorder, unspecified: F31.9

## 2023-02-05 HISTORY — DX: Depression, unspecified: F32.A

## 2023-02-05 HISTORY — DX: Post-traumatic stress disorder, unspecified: F43.10

## 2023-02-05 HISTORY — DX: Headache, unspecified: R51.9

## 2023-02-05 HISTORY — DX: Hyperlipidemia, unspecified: E78.5

## 2023-02-05 HISTORY — DX: Anxiety disorder, unspecified: F41.9

## 2023-02-05 HISTORY — DX: Anemia, unspecified: D64.9

## 2023-02-05 HISTORY — DX: Gastro-esophageal reflux disease without esophagitis: K21.9

## 2023-02-05 HISTORY — DX: Unspecified osteoarthritis, unspecified site: M19.90

## 2023-02-05 HISTORY — DX: Other specified postprocedural states: R11.2

## 2023-02-05 SURGERY — CARPAL TUNNEL RELEASE
Anesthesia: Choice | Laterality: Right

## 2023-04-11 IMAGING — DX DG ANKLE COMPLETE 3+V*R*
3 series · 3 of 3 positions shown · non-contrast
Comparison: None.

CLINICAL DATA: Right lateral malleolar pain x 3 days, status post
lifting/twisting injury.

EXAM:
RIGHT ANKLE - COMPLETE 3+ VIEW

[dg ankle complete right (1 of 3)]
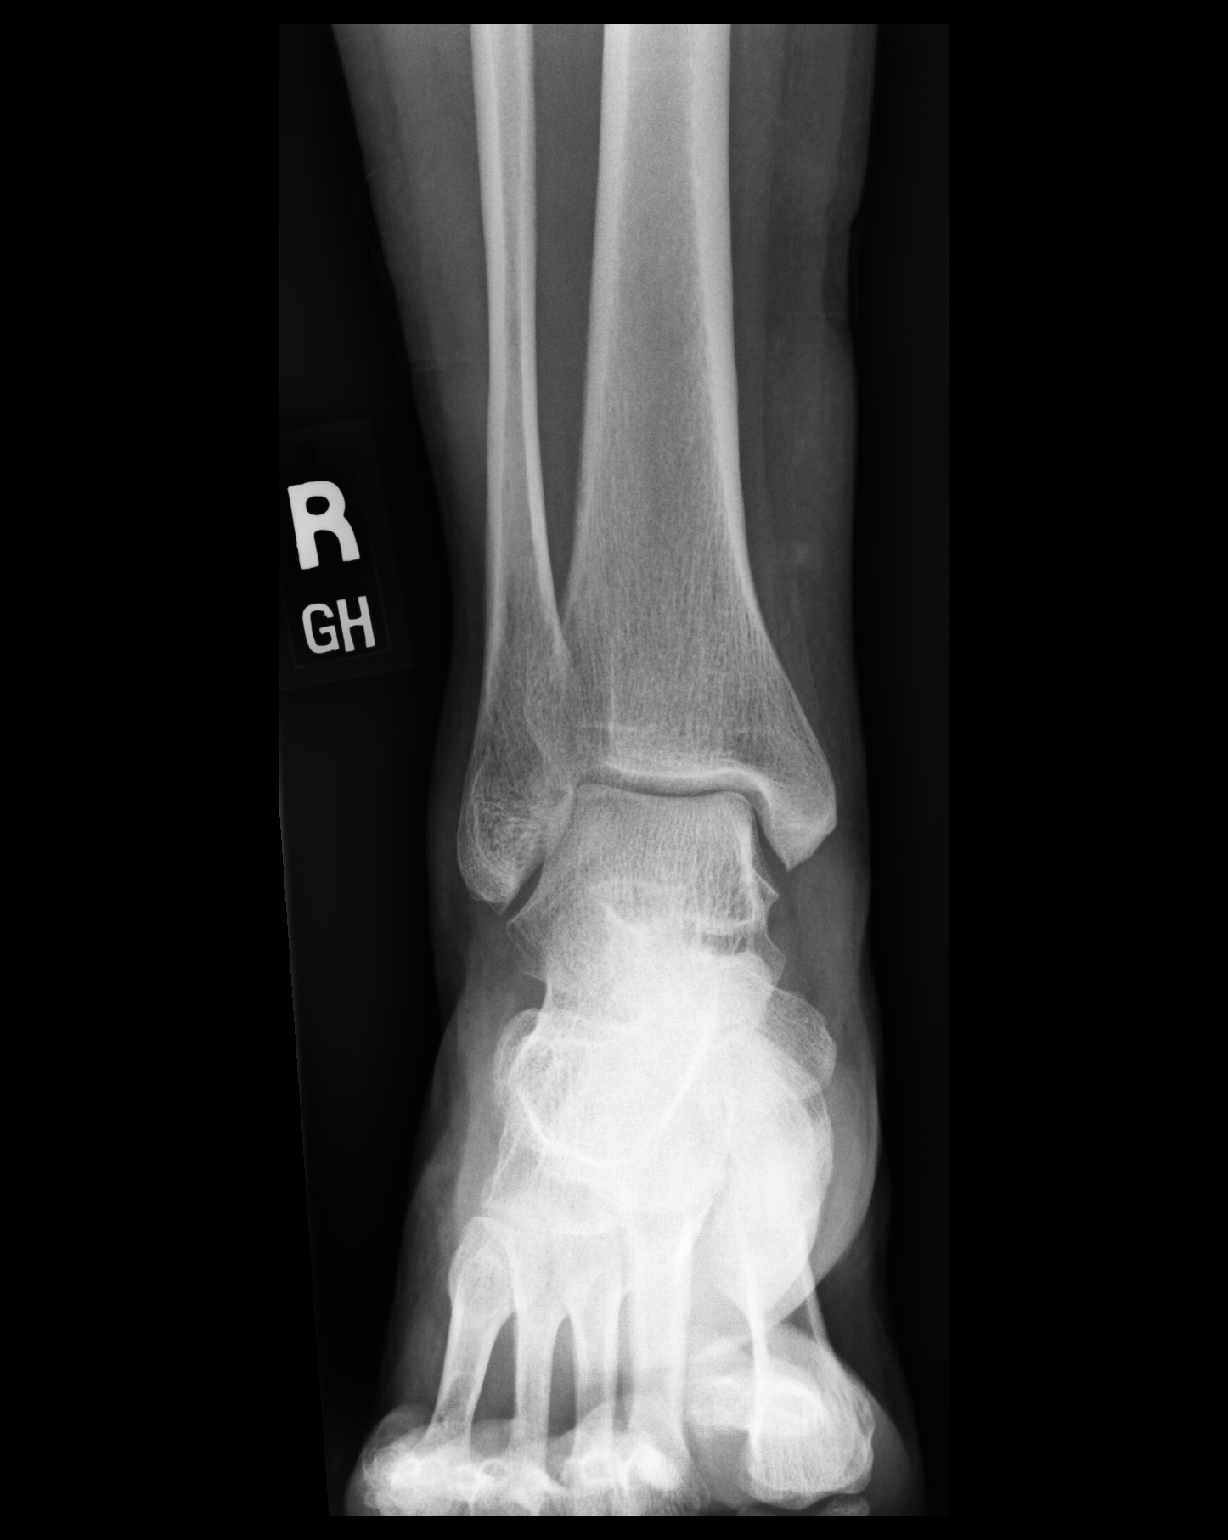

[dg ankle complete right (2 of 3)]
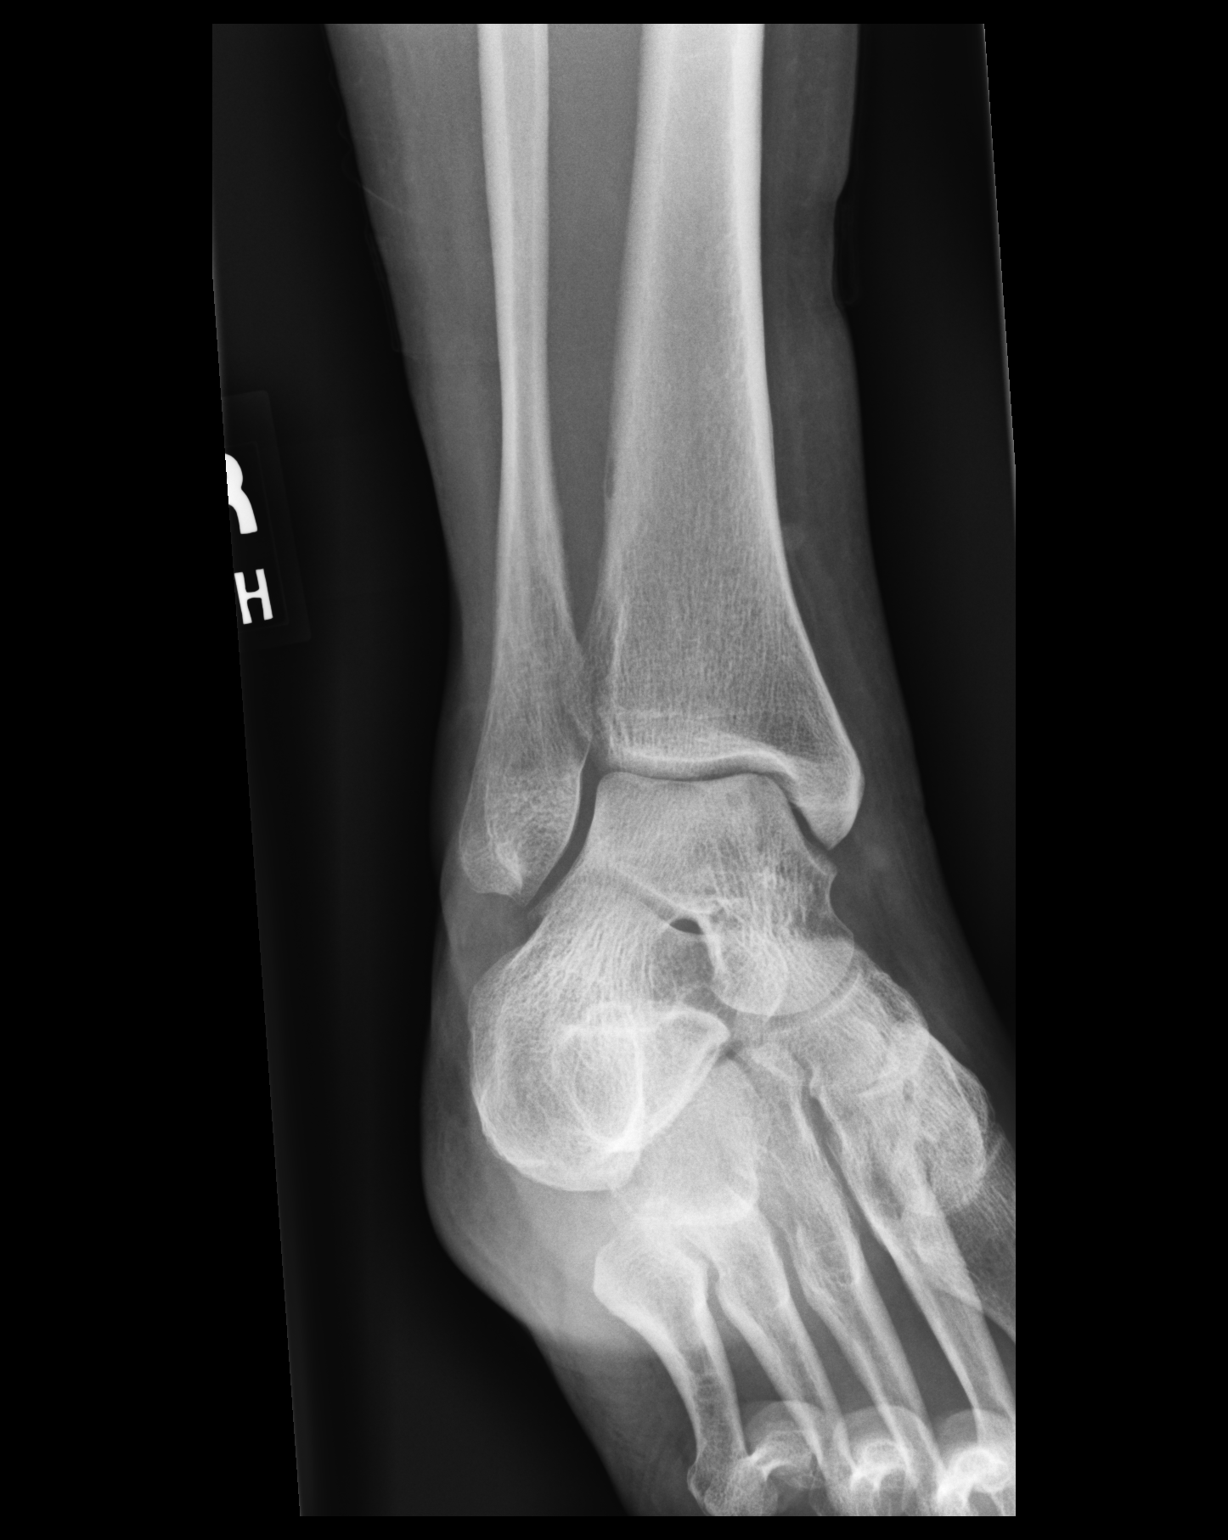

[dg ankle complete right (3 of 3)]
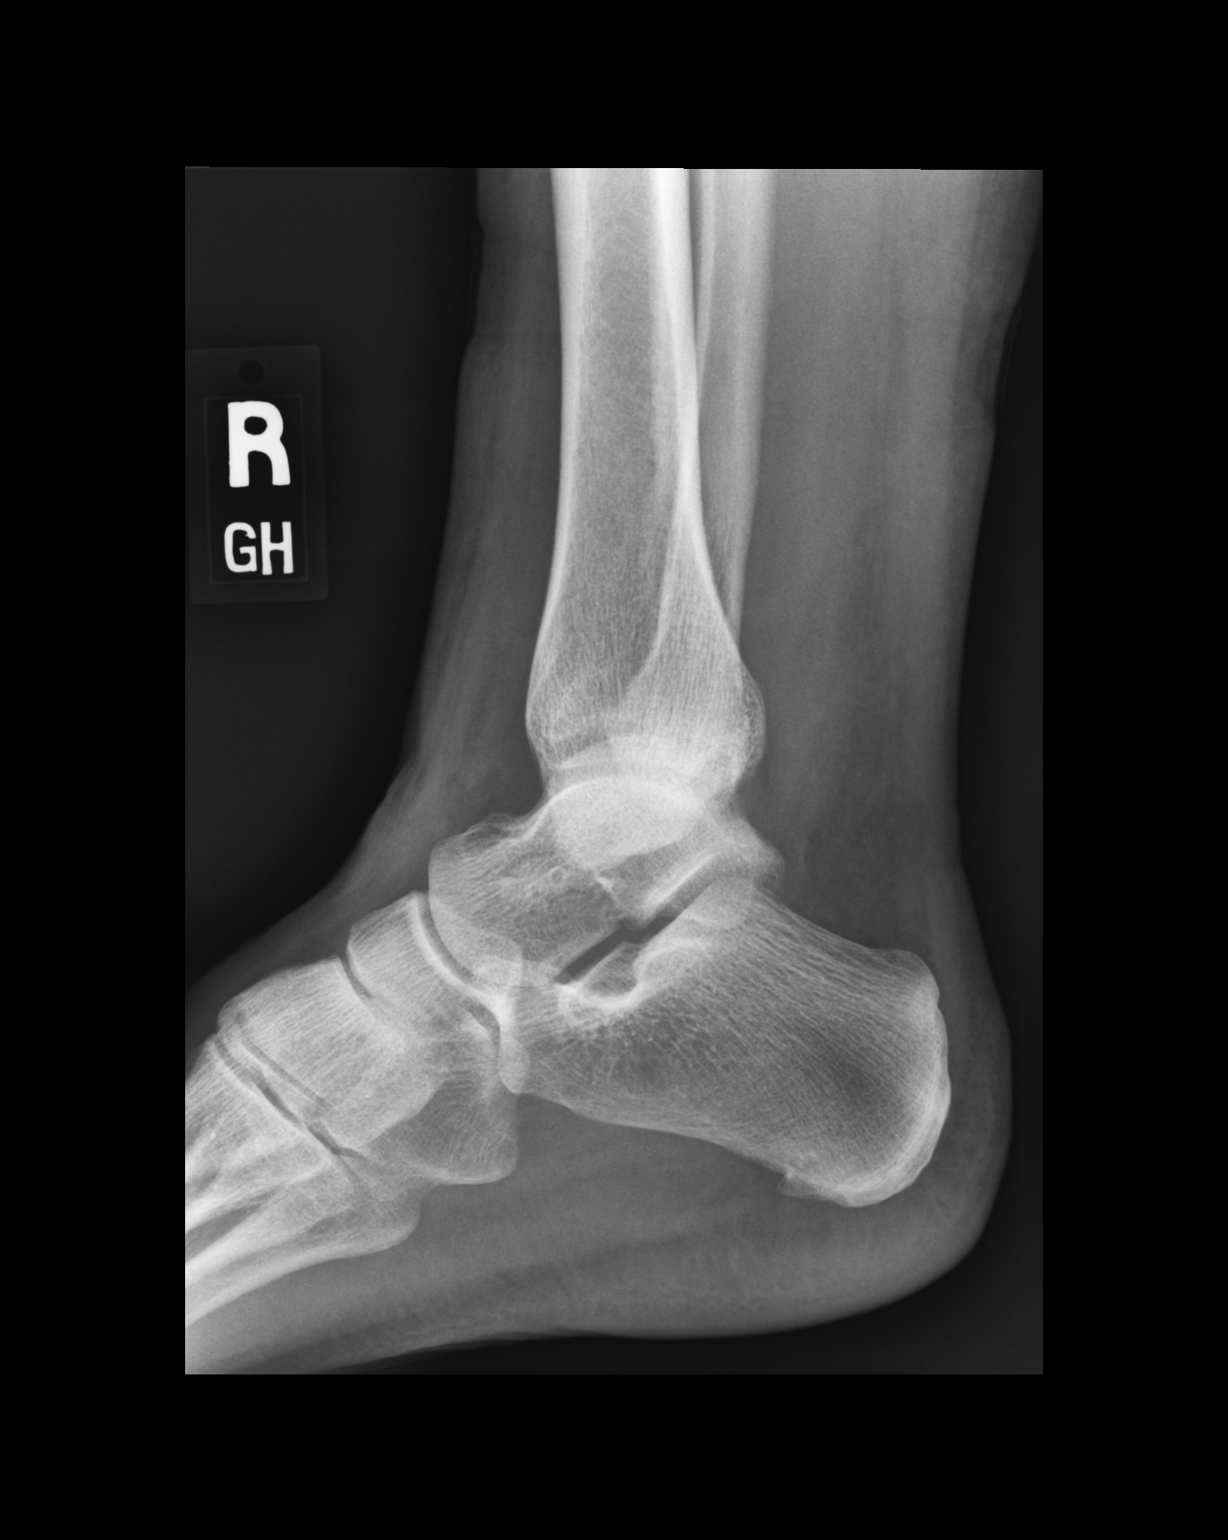

[3 of 3 positions shown; findings below may reference images not displayed]

FINDINGS: There is no evidence of fracture, dislocation, or joint effusion.
There is no evidence of arthropathy or other focal bone abnormality.
Soft tissues are unremarkable.
IMPRESSION: Negative.
